# Patient Record
Sex: Female | Born: 1965 | Race: Asian | Hispanic: No | Marital: Married | Smoking: Never smoker
Health system: Southern US, Community
[De-identification: ages and names within clinical notes are randomized; demographics above are authoritative.]

## PROBLEM LIST (undated history)

## (undated) DIAGNOSIS — E0500 Thyrotoxicosis with diffuse goiter without thyrotoxic crisis or storm: Secondary | ICD-10-CM

## (undated) DIAGNOSIS — R51 Headache: Secondary | ICD-10-CM

## (undated) DIAGNOSIS — I471 Supraventricular tachycardia, unspecified: Secondary | ICD-10-CM

## (undated) DIAGNOSIS — E079 Disorder of thyroid, unspecified: Secondary | ICD-10-CM

## (undated) DIAGNOSIS — E785 Hyperlipidemia, unspecified: Secondary | ICD-10-CM

## (undated) DIAGNOSIS — R519 Headache, unspecified: Secondary | ICD-10-CM

## (undated) DIAGNOSIS — R112 Nausea with vomiting, unspecified: Secondary | ICD-10-CM

## (undated) DIAGNOSIS — T4145XA Adverse effect of unspecified anesthetic, initial encounter: Secondary | ICD-10-CM

## (undated) DIAGNOSIS — T8859XA Other complications of anesthesia, initial encounter: Secondary | ICD-10-CM

## (undated) DIAGNOSIS — I499 Cardiac arrhythmia, unspecified: Secondary | ICD-10-CM

## (undated) DIAGNOSIS — Z9889 Other specified postprocedural states: Secondary | ICD-10-CM

## (undated) DIAGNOSIS — K219 Gastro-esophageal reflux disease without esophagitis: Secondary | ICD-10-CM

## (undated) HISTORY — PX: THYROIDECTOMY: SHX17

## (undated) HISTORY — DX: Headache, unspecified: R51.9

## (undated) HISTORY — DX: Thyrotoxicosis with diffuse goiter without thyrotoxic crisis or storm: E05.00

## (undated) HISTORY — DX: Supraventricular tachycardia: I47.1

## (undated) HISTORY — PX: ABDOMINAL HYSTERECTOMY: SHX81

## (undated) HISTORY — PX: BACK SURGERY: SHX140

## (undated) HISTORY — DX: Supraventricular tachycardia, unspecified: I47.10

## (undated) HISTORY — PX: TUBAL LIGATION: SHX77

## (undated) HISTORY — PX: LASIK: SHX215

## (undated) HISTORY — DX: Headache: R51

## (undated) HISTORY — DX: Hyperlipidemia, unspecified: E78.5

---

## 2009-03-12 ENCOUNTER — Inpatient Hospital Stay (HOSPITAL_COMMUNITY): Admission: RE | Admit: 2009-03-12 | Discharge: 2009-03-15 | Payer: Self-pay | Admitting: Neurosurgery

## 2011-01-09 LAB — CBC
Hemoglobin: 14.3 g/dL (ref 12.0–15.0)
MCHC: 34.6 g/dL (ref 30.0–36.0)
MCV: 85 fL (ref 78.0–100.0)
RBC: 4.85 MIL/uL (ref 3.87–5.11)
RDW: 12.4 % (ref 11.5–15.5)

## 2011-01-09 LAB — TYPE AND SCREEN: Antibody Screen: NEGATIVE

## 2011-02-14 NOTE — Op Note (Signed)
[name], MODE                 ACCOUNT NO.:  [id number]   MEDICAL RECORD NO.:  [id number]          PATIENT TYPE:  INP   LOCATION:  2899                         FACILITY:  [facility]   PHYSICIAN:  Danae Orleans. Venetia Maxon, M.D.  DATE OF BIRTH:  1966/04/16   DATE OF PROCEDURE:  03/12/2009  DATE OF DISCHARGE:                               OPERATIVE REPORT   PREOPERATIVE DIAGNOSIS:  Recurrent herniated lumbar disk L5-S1 with  degenerative disk disease, spondylosis, and radiculopathy.   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   PROCEDURES:  1. Redo laminectomy L5-S1  with diskectomy.  2. Posterior lumbar interbody fusion with PEEK interbody cages,      morcellized bone autograft BMP and EquivaBone.  3. Pedicle screw fixation nonsegmental L5 through S1.  4. Posterolateral arthrodesis L5 through S1 levels.   SURGEON:  Danae Orleans. Venetia Maxon, MD   ASSISTANT:  Coletta Memos, MD   ANESTHESIA:  General endotracheal anesthesia.   ESTIMATED BLOOD LOSS:  200 mL.   COMPLICATIONS:  None.   DISPOSITION:  To recovery.   INDICATIONS:  Melissa Levy is a 45 year old woman who previously had a  right L5-S1 diskectomy done at [facility].  She has developed severe  intractable back pain and had recurrent right leg pain to the point  where she is not able to stand or bear weight on her right leg.  An MRI  was obtained which was suggestive of the thickened S1 nerve root on the  right and I suspected that she had a recurrent disk herniation with  compression of the right S1 nerve root.  She also has complete collapse  of the interspace at L5-S1 with modic changes in the endplates.  So, it  was elected to take her to surgery for redo diskectomy and fusion at  this level.   PROCEDURE:  Ms. Artis Flock was brought to the operating room.  Following the  satisfactory and uncomplicated induction of general endotracheal  anesthesia and placement  of intravenous lines and Foley catheter, the  patient was placed in a prone position on the [place] table.  Soft  tissue and bony prominences were padded appropriately.  Low back was  prepped and draped in usual sterile fashion.  Previous incision was  infiltrated with local lidocaine, reopened, extended slightly cephalad  and caudad and carried through adipose tissue.  The lumbodorsal fascia  was incised bilaterally.  Subperiosteal dissection was performed  exposing the L5-S1 interspace, the L5 transverse processes and sacral  ala bilaterally.  Intraoperative x-ray confirmed correct orientation.  A  previous scar tissue was removed on the right-sided for right sided  laminotomy and laminotomy and the facetectomy was performed.  The S1  nerve root was carefully dissected free of investing scar tissue on  using loupe magnification while mobilizing the nerve root and several  large fragments of herniated disk material were removed, which were  directly compressing the S1 nerve root and this relieved the nerve root  significantly and the nerve root no longer appeared to be significantly  deflected or  thickened or tethered.  The interspace was incised and the  spondylitic disk herniation was removed.  Attention was then turned to  the left side where decompression was performed with laminectomy and  facetectomy and a thorough diskectomy.  A #8 interspace distractor was  placed which opened the interspace and a more thorough diskectomy was  then performed on the right side, the interspace has been collapsed at  this level.  After thorough diskectomy and preparation of the endplates,  it was elected to use an 8-mm PEEK interbody cage.  This was packed with  extra small BMP and EquivaBone.  Additional BMP and EquivaBone were  placed within the interspace.  The implant was tamped into position and  countersunk appropriately.  Attention was then turned to the left side  where similarly sized  implant was positioned also with BMP and  EquivaBone.  Additional autograft was placed overlying the cages on  either side tamped into position.  The intraoperative x-ray was  obtained, which showed well-positioned interbody cages, restoration of  disk space height.  Pedicle screw fixation was then placed after  decorticating posterolateral region from L5 through S1.  A 6.5 x 40 mm  sacral screws were placed and 5.5 x 45-mm screws were placed at L5, all  screws had excellent purchase.  Their positioning was confirmed on AP  and lateral fluoroscopy.  No evidence any cutouts.  The 35-mm preloaded  rods were affixed to the screw heads and locked down in situ.  Posterolateral region between L5 and S1 was then packed with remaining  EquivaBone and autograft.  Nerve roots were inspected and felt to be  well decompressed.  Prior to placing final bone graft, the wound was  irrigated.  The self-retaining retractor was removed.  The lumbodorsal  fascia was closed with Vicryl sutures.  Subcutaneous tissues were  approximated with 2-0 Vicryl interrupted inverted sutures and skin edges  were approximated with interrupted 2-0 Vicryl subcu stitch.  The wound  was dressed with Benzoin, Steri-Strips, Telfa gauze, and tape.  The  patient was extubated in the operating room, taken to the recovery room  in stable and satisfactory condition having tolerated the operation  well.  Counts were correct at the end of the case.      Danae Orleans. Venetia Maxon, M.D.  Electronically Signed     JDS/MEDQ  D:  03/12/2009  T:  03/13/2009  Job:  [id number]

## 2011-02-17 NOTE — Discharge Summary (Signed)
Melissa Levy, Melissa Levy                 ACCOUNT NO.:  [id number]   MEDICAL RECORD NO.:  [id number]          PATIENT TYPE:  INP   LOCATION:  3021                         FACILITY:  [facility]   PHYSICIAN:  Danae Orleans. Venetia Maxon, M.D.  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   FINAL DIAGNOSES:  1. 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  2. Lumbar disk degeneration.  3. 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  4. Constipation.  5. Depressive disorder, NEC.   HISTORY OF PRESENT ILLNESS AND HOSPITAL COURSE:  Melissa Levy is a 45-  year-old woman who has previously undergone lumbar diskectomy at L5-S1  at [facility], she developed in 2005.  She has had progressive disk space  collapse and has recurrent disk herniation with significant back greater  than lower extremity pain.  It was elected to take her to surgery for  redo diskectomy, decompression, and fusion at the L5-S1 level.  This was  done on same day admission basis.  She had uncomplicated surgical  procedure.  Postoperatively, she was gradually mobilized.  Back brace  was doing well.  Discharged home on 14th.  Given instructions to follow  up in the office for postoperative visit.  She was discharged home with  some Percocet for pain.      Danae Orleans. Venetia Maxon, M.D.  Electronically Signed     JDS/MEDQ  D:  04/23/2009  T:  04/24/2009  Job:  [id number]

## 2013-05-16 ENCOUNTER — Other Ambulatory Visit: Payer: Self-pay

## 2013-05-16 ENCOUNTER — Other Ambulatory Visit: Payer: Self-pay | Admitting: Physician Assistant

## 2013-05-16 DIAGNOSIS — Z1231 Encounter for screening mammogram for malignant neoplasm of breast: Secondary | ICD-10-CM

## 2013-06-23 ENCOUNTER — Ambulatory Visit
Admission: RE | Admit: 2013-06-23 | Discharge: 2013-06-23 | Disposition: A | Discharge status: Home / Self Care | Payer: BC Managed Care – PPO | Source: Ambulatory Visit

## 2013-06-23 DIAGNOSIS — Z1231 Encounter for screening mammogram for malignant neoplasm of breast: Secondary | ICD-10-CM

## 2014-04-18 ENCOUNTER — Encounter (HOSPITAL_COMMUNITY): Payer: Self-pay | Admitting: Emergency Medicine

## 2014-04-18 ENCOUNTER — Emergency Department (HOSPITAL_COMMUNITY)
Admission: EM | Admit: 2014-04-18 | Discharge: 2014-04-18 | Disposition: A | Discharge status: Home / Self Care | Payer: BC Managed Care – PPO | Attending: Emergency Medicine | Admitting: Emergency Medicine

## 2014-04-18 DIAGNOSIS — R110 Nausea: Secondary | ICD-10-CM | POA: Insufficient documentation

## 2014-04-18 DIAGNOSIS — R0989 Other specified symptoms and signs involving the circulatory and respiratory systems: Secondary | ICD-10-CM | POA: Insufficient documentation

## 2014-04-18 DIAGNOSIS — R61 Generalized hyperhidrosis: Secondary | ICD-10-CM | POA: Insufficient documentation

## 2014-04-18 DIAGNOSIS — R0609 Other forms of dyspnea: Secondary | ICD-10-CM | POA: Insufficient documentation

## 2014-04-18 DIAGNOSIS — I471 Supraventricular tachycardia, unspecified: Secondary | ICD-10-CM | POA: Insufficient documentation

## 2014-04-18 DIAGNOSIS — Z8639 Personal history of other endocrine, nutritional and metabolic disease: Secondary | ICD-10-CM | POA: Insufficient documentation

## 2014-04-18 DIAGNOSIS — Z862 Personal history of diseases of the blood and blood-forming organs and certain disorders involving the immune mechanism: Secondary | ICD-10-CM | POA: Insufficient documentation

## 2014-04-18 HISTORY — DX: Disorder of thyroid, unspecified: E07.9

## 2014-04-18 LAB — CBC WITH DIFFERENTIAL/PLATELET
BASOS ABS: 0.1 10*3/uL (ref 0.0–0.1)
BASOS PCT: 0 % (ref 0–1)
Eosinophils Absolute: 0.1 10*3/uL (ref 0.0–0.7)
Eosinophils Relative: 1 % (ref 0–5)
HCT: 38.1 % (ref 36.0–46.0)
Hemoglobin: 13.6 g/dL (ref 12.0–15.0)
Lymphocytes Relative: 21 % (ref 12–46)
Lymphs Abs: 3.2 10*3/uL (ref 0.7–4.0)
MCH: 30.4 pg (ref 26.0–34.0)
MCHC: 35.7 g/dL (ref 30.0–36.0)
MCV: 85 fL (ref 78.0–100.0)
Monocytes Absolute: 0.9 10*3/uL (ref 0.1–1.0)
Monocytes Relative: 6 % (ref 3–12)
NEUTROS ABS: 11.2 10*3/uL — AB (ref 1.7–7.7)
NEUTROS PCT: 72 % (ref 43–77)
PLATELETS: 321 10*3/uL (ref 150–400)
RBC: 4.48 MIL/uL (ref 3.87–5.11)
RDW: 12.4 % (ref 11.5–15.5)
WBC: 15.4 10*3/uL — ABNORMAL HIGH (ref 4.0–10.5)

## 2014-04-18 LAB — BASIC METABOLIC PANEL
ANION GAP: 14 (ref 5–15)
BUN: 13 mg/dL (ref 6–23)
CHLORIDE: 102 meq/L (ref 96–112)
CO2: 22 mEq/L (ref 19–32)
Calcium: 9.7 mg/dL (ref 8.4–10.5)
Creatinine, Ser: 0.8 mg/dL (ref 0.50–1.10)
GFR calc non Af Amer: 87 mL/min — ABNORMAL LOW (ref >90)
Glucose, Bld: 175 mg/dL — ABNORMAL HIGH (ref 70–99)
POTASSIUM: 4.2 meq/L (ref 3.7–5.3)
Sodium: 138 mEq/L (ref 137–147)

## 2014-04-18 LAB — I-STAT CG4 LACTIC ACID, ED
LACTIC ACID, VENOUS: 1.83 mmol/L (ref 0.5–2.2)
Lactic Acid, Venous: 2.94 mmol/L — ABNORMAL HIGH (ref 0.5–2.2)

## 2014-04-18 MED ORDER — SODIUM CHLORIDE 0.9 % IV SOLN
1000.0000 mL | Freq: Once | INTRAVENOUS | Status: AC
Start: 2014-04-18 — End: 2014-04-18
  Administered 2014-04-18: 1000 mL via INTRAVENOUS

## 2014-04-18 MED ORDER — ONDANSETRON HCL 4 MG/2ML IJ SOLN
INTRAMUSCULAR | Status: AC
  Filled 2014-04-18: qty 2

## 2014-04-18 MED ORDER — ONDANSETRON HCL 4 MG/2ML IJ SOLN
4.0000 mg | Freq: Once | INTRAMUSCULAR | Status: DC
Start: 2014-04-18 — End: 2014-04-18

## 2014-04-18 MED ORDER — SODIUM CHLORIDE 0.9 % IV SOLN
1000.0000 mL | INTRAVENOUS | Status: DC
  Administered 2014-04-18: 1000 mL via INTRAVENOUS

## 2014-04-18 MED ORDER — ADENOSINE 6 MG/2ML IV SOLN
INTRAVENOUS | Status: AC
  Filled 2014-04-18: qty 2

## 2014-04-18 MED ORDER — ADENOSINE 6 MG/2ML IV SOLN
INTRAVENOUS | Status: AC
  Administered 2014-04-18: 6 mg
  Filled 2014-04-18: qty 2

## 2014-04-18 MED ORDER — ONDANSETRON HCL 4 MG/2ML IJ SOLN
4.0000 mg | Freq: Once | INTRAMUSCULAR | Status: AC
  Administered 2014-04-18: 4 mg via INTRAVENOUS

## 2014-04-18 NOTE — Discharge Instructions (Signed)
Supraventricular Tachycardia °Supraventricular tachycardia (SVT) is an abnormal heart rhythm (arrhythmia) that causes the heart to beat very fast (tachycardia). This kind of fast heartbeat originates in the upper chambers of the heart (atria). SVT can cause the heart to beat greater than 100 beats per minute. SVT can have a rapid burst of heartbeats. This can start and stop suddenly without warning and is called nonsustained. SVT can also be sustained, in which the heart beats at a continuous fast rate.  °CAUSES  °There can be different causes of SVT. Some of these include: °· Heart valve problems such as mitral valve prolapse. °· An enlarged heart (hypertrophic cardiomyopathy). °· Congenital heart problems. °· Heart inflammation (pericarditis). °· Hyperthyroidism. °· Low potassium or magnesium levels. °· Caffeine. °· Drug use such as cocaine, methamphetamines, or stimulants. °· Some over-the-counter medicines such as: °¨ Decongestants. °¨ Diet medicines. °¨ Herbal medicines. °SYMPTOMS  °Symptoms of SVT can vary. Symptoms depend on whether the SVT is sustained or nonsustained. You may experience: °· No symptoms (asymptomatic). °· An awareness of your heart beating rapidly (palpitations). °· Shortness of breath. °· Chest pain or pressure. °If your blood pressure drops because of the SVT, you may experience: °· Fainting or near fainting. °· Weakness. °· Dizziness. °DIAGNOSIS  °Different tests can be performed to diagnose SVT, such as: °· An electrocardiogram (EKG). This is a painless test that records the electrical activity of your heart. °· Holter monitor. This is a 24 hour recording of your heart rhythm. You will be given a diary. Write down all symptoms that you have and what you were doing at the time you experienced symptoms. °· Arrhythmia monitor. This is a small device that your wear for several weeks. It records the heart rhythm when you have symptoms. °· Echocardiogram. This is an imaging test to help detect  abnormal heart structure such as congenital abnormalities, heart valve problems, or heart enlargement. °· Stress test. This test can help determine if the SVT is related to exercise. °· Electrophysiology study (EPS). This is a procedure that evaluates your heart's electrical system and can help your caregiver find the cause of your SVT. °TREATMENT  °Treatment of SVT depends on the symptoms, how often it recurs, and whether there are any underlying heart problems.  °· If symptoms are rare and no other cardiac disease is present, no treatment may be needed. °· Blood work may be done to check potassium, magnesium, and thyroid hormone levels to see if they are abnormal. If these levels are abnormal, treatment to correct the problems will occur. °Medicines °Your caregiver may use oral medicines to treat SVT. These medicines are given for long-term control of SVT. Medicines may be used alone or in combination with other treatments. These medicines work to slow nerve impulses in the heart muscle. These medicines can also be used to treat high blood pressure. Some of these medicines may include: °· Calcium channel blockers. °· Beta blockers. °· Digoxin. °Nonsurgical procedures °Nonsurgical techniques may be used if oral medicines do not work. Some examples include: °· Cardioversion. This technique uses either drugs or an electrical shock to restore a normal heart rhythm. °¨ Cardioversion drugs may be given through an intravenous (IV) line to help "reset" the heart rhythm. °¨ In electrical cardioversion, the caregiver shocks your heart to stop its beat for a split second. This helps to reset the heart to a normal rhythm. °· Ablation. This procedure is done under mild sedation. High frequency radio wave energy is used to   destroy the area of heart tissue responsible for the SVT. °HOME CARE INSTRUCTIONS  °· Do not smoke. °· Only take medicines prescribed by your caregiver. Check with your caregiver before using over-the-counter  medicines. °· Check with your caregiver about how much alcohol and caffeine (coffee, tea, colas, or chocolate) you may have. °· It is very important to keep all follow-up referrals and appointments in order to properly manage this problem. °SEEK IMMEDIATE MEDICAL CARE IF: °· You have dizziness. °· You faint or nearly faint. °· You have shortness of breath. °· You have chest pain or pressure. °· You have sudden nausea or vomiting. °· You have profuse sweating. °· You are concerned about how long your symptoms last. °· You are concerned about the frequency of your SVT episodes. °If you have the above symptoms, call your local emergency services (911 in [place] immediately. Do not drive yourself to the hospital. °MAKE SURE YOU:  °· Understand these instructions. °· Will watch your condition. °· Will get help right away if you are not doing well or get worse. °Document Released: 09/18/2005 Document Revised: 12/11/2011 Document Reviewed: 12/31/2008 °ExitCare® Patient Information ©2015 [employer]. This information is not intended to replace advice given to you by your health care provider. Make sure you discuss any questions you have with your health care provider. ° °

## 2014-04-18 NOTE — ED Notes (Signed)
Patient states she has a history of a-fib and around 2300, she was awakened by palpitations, shortness of breath, nausea and sweating.

## 2014-04-18 NOTE — ED Provider Notes (Signed)
CSN: [id number]     Arrival date & time 04/18/14  0157 History   First MD Initiated Contact with Patient 04/18/14 0210     Chief Complaint  Patient presents with  . Chest Pain     (Consider location/radiation/quality/duration/timing/severity/associated sxs/prior Treatment) Patient is a 48 y.o. female presenting with chest pain. The history is provided by the patient.  Chest Pain She was awakened at about 11 PM with a sense of her heart racing. This is associated with dyspnea and tightness in her chest and nausea and diaphoresis. She has had similar episodes in the past from atrial fibrillation. She tried a variety of vagal maneuvers to try to reduce her heart rate but these were unsuccessful. She states that she had not felt well all day. She states her last episode was about 3 years ago. She had had some alcohol earlier in the day and normally she does not drink alcohol.  Past Medical History  Diagnosis Date  . Atrial fibrillation   . Thyroid disease    History reviewed. No pertinent past surgical history. No family history on file. History  Substance Use Topics  . Smoking status: Never Smoker   . Smokeless tobacco: Not on file  . Alcohol Use: Yes   OB History   Grav Para Term Preterm Abortions TAB SAB Ect Mult Living                 Review of Systems  Cardiovascular: Positive for chest pain.  All other systems reviewed and are negative.     Allergies  Dilaudid  Home Medications   Prior to Admission medications   Not on File   BP 75/51  Pulse 216  Temp(Src) 97.8 F (36.6 C) (Oral)  Resp 24  Ht 5\' 9"  (1.753 m)  Wt 230 lb (104.327 kg)  BMI 33.95 kg/m2  SpO2 100% Physical Exam  Nursing note and vitals reviewed.  48 year old female, resting comfortably and in no acute distress. Vital signs are significant for hypertension with blood pressure 75/51, and tachycardia with heart rate of 216, and tachypnea with respiratory rate of 24. Oxygen saturation is 100%,  which is normal. Head is normocephalic and atraumatic. PERRLA, EOMI. Oropharynx is clear. Neck is nontender and supple without adenopathy or JVD. Back is nontender and there is no CVA tenderness. Lungs are clear without rales, wheezes, or rhonchi. Chest is nontender. Heart is tachycardic without murmur. Abdomen is soft, flat, nontender without masses or hepatosplenomegaly and peristalsis is normoactive. Extremities have no cyanosis or edema, full range of motion is present. Skin is warm and mildly diaphoretic without rash. Neurologic: Mental status is normal, cranial nerves are intact, there are no motor or sensory deficits.  ED Course  Procedures (including critical care time) Labs Review Results for orders placed during the hospital encounter of 04/18/14  CBC WITH DIFFERENTIAL      Result Value Ref Range   WBC 15.4 (*) 4.0 - 10.5 K/uL   RBC 4.48  3.87 - 5.11 MIL/uL   Hemoglobin 13.6  12.0 - 15.0 g/dL   HCT 65.738.1  84.636.0 - 96.246.0 %   MCV 85.0  78.0 - 100.0 fL   MCH 30.4  26.0 - 34.0 pg   MCHC 35.7  30.0 - 36.0 g/dL   RDW 95.212.4  84.111.5 - 32.415.5 %   Platelets 321  150 - 400 K/uL   Neutrophils Relative % 72  43 - 77 %   Neutro Abs 11.2 (*) 1.7 - 7.7  K/uL   Lymphocytes Relative 21  12 - 46 %   Lymphs Abs 3.2  0.7 - 4.0 K/uL   Monocytes Relative 6  3 - 12 %   Monocytes Absolute 0.9  0.1 - 1.0 K/uL   Eosinophils Relative 1  0 - 5 %   Eosinophils Absolute 0.1  0.0 - 0.7 K/uL   Basophils Relative 0  0 - 1 %   Basophils Absolute 0.1  0.0 - 0.1 K/uL  BASIC METABOLIC PANEL      Result Value Ref Range   Sodium 138  137 - 147 mEq/L   Potassium 4.2  3.7 - 5.3 mEq/L   Chloride 102  96 - 112 mEq/L   CO2 22  19 - 32 mEq/L   Glucose, Bld 175 (*) 70 - 99 mg/dL   BUN 13  6 - 23 mg/dL   Creatinine, Ser 4.09  0.50 - 1.10 mg/dL   Calcium 9.7  8.4 - 81.1 mg/dL   GFR calc non Af Amer 87 (*) >90 mL/min   GFR calc Af Amer >90  >90 mL/min   Anion gap 14  5 - 15  I-STAT CG4 LACTIC ACID, ED      Result  Value Ref Range   Lactic Acid, Venous 2.94 (*) 0.5 - 2.2 mmol/L  I-STAT CG4 LACTIC ACID, ED      Result Value Ref Range   Lactic Acid, Venous 1.83  0.5 - 2.2 mmol/L   Procedure: Chemical cardioversion Indication: Supraventricular tachycardia Informed consent not obtained because of the emergent nature of procedure Prior to procedure, the patient was identified by 2 identifiers-verbally and with name band. A preprocedure timeout was held. Description: The patient was given 6 mg of adenosine intravenously with successful conversion to sinus rhythm. Patient tolerated procedure well without complications.  Imaging Review No results found.   EKG Interpretation   Date/Time:  Saturday April 18 2014 02:04:38 EDT Ventricular Rate:  206 PR Interval:    QRS Duration: 71 QT Interval:  267 QTC Calculation: 494 R Axis:   80 Text Interpretation:  Supraventricular tachycardia ST depression, probably  rate related No old tracing to compare Confirmed by [facility]  MD, Dechelle Attaway  ([id number]) on 04/18/2014 2:20:57 AM       EKG Interpretation   Date/Time:  Saturday April 18 2014 02:15:41 EDT Ventricular Rate:  101 PR Interval:  150 QRS Duration: 86 QT Interval:  348 QTC Calculation: 451 R Axis:   77 Text Interpretation:  Sinus tachycardia Otherwise within normal limits  When compared with ECG of 04/18/2014, Sinus tachycardia has replaced  Supraventricular tachycardia ST depression has resolved Confirmed by [facility]   MD, Luwanda Starr ([id number]) on 04/18/2014 2:23:43 AM       MDM   Final diagnoses:  Paroxysmal supraventricular tachycardia    Paroxysmal supraventricular tachycardia. She's given an dose of adenosine with successful conversion. Following this, chest tightness and dyspnea have improved. Because of hypotension, lactic acid level will be checked and she will be given IV hydration.  Initial lactic acid levels come back slightly elevated but has normalized with IV hydration and normalization of heart  rhythm. She has been observed in the ED with ongoing borderline hypertension but blood pressure is now over 100. She has had no further rhythm disturbance and is felt to be stable to go home.  Dione Booze, MD 04/18/14 [phone]

## 2014-04-22 ENCOUNTER — Encounter: Payer: Self-pay | Admitting: Cardiology

## 2014-04-22 ENCOUNTER — Ambulatory Visit (INDEPENDENT_AMBULATORY_CARE_PROVIDER_SITE_OTHER): Payer: BC Managed Care – PPO | Admitting: Cardiology

## 2014-04-22 VITALS — BP 120/90 | HR 90 | Ht 69.0 in | Wt 226.0 lb

## 2014-04-22 DIAGNOSIS — R7309 Other abnormal glucose: Secondary | ICD-10-CM

## 2014-04-22 DIAGNOSIS — I471 Supraventricular tachycardia: Secondary | ICD-10-CM

## 2014-04-22 DIAGNOSIS — R079 Chest pain, unspecified: Secondary | ICD-10-CM

## 2014-04-22 DIAGNOSIS — D72829 Elevated white blood cell count, unspecified: Secondary | ICD-10-CM | POA: Insufficient documentation

## 2014-04-22 DIAGNOSIS — I498 Other specified cardiac arrhythmias: Secondary | ICD-10-CM

## 2014-04-22 MED ORDER — METOPROLOL SUCCINATE ER 25 MG PO TB24: 25.0000 mg | ORAL_TABLET | Freq: Every day | ORAL | Status: DC

## 2014-04-22 NOTE — Addendum Note (Signed)
Addended by: Freddi StarrMATHIS, Merikay Lesniewski W on: 04/22/2014 05:22 PM   Modules accepted: Orders

## 2014-04-22 NOTE — Assessment & Plan Note (Signed)
Patient has documented supraventricular tachycardia.She has had spells intermittently since her 30s. She was extremely symptomatic with her recent episode that required adenosine. 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 Add Toprol 25 mg daily. Schedule echocardiogram to assess LV function. I will arrange electrophysiology evaluation for ablation.

## 2014-04-22 NOTE — Progress Notes (Signed)
HPI: 12 show female for evaluation of SVT. Recently in the emergency room for palpitations and found to be in SVT. Given adenosine and converted to sinus rhythm. Laboratories showed a white blood cell count of 15.4 and a glucose of 175. Patient has had occasional bursts of palpitations since her 30s. These were typically short lived and can be relieved with Valsalva or belching. On July 17 she was awakened from sleep with palpitations described as her heart racing. She then developed chest tightness, dyspnea and diaphoresis. There was mild dizziness. Her symptoms persisted and she went to the emergency room as described above. Since that time she has continued to have chest tightness continuously. There is increased with exertion and also deep inspiration. She has felt lightheaded as well. No further palpitations. She typically does not have dyspnea on exertion, orthopnea, PND, pedal edema, exertional chest pain or syncope. She has not traveled recently and no recent leg injury.  Current Outpatient Prescriptions  Medication Sig Dispense Refill  . ALPRAZolam (XANAX) 0.25 MG tablet Take 0.25 mg by mouth as needed.       . Black Cohosh 540 MG CAPS Take 540 mg by mouth daily.      . Cranberry 500 MG CAPS Take by mouth daily.      Marland Kitchen ibuprofen (ADVIL,MOTRIN) 200 MG tablet Take 200 mg by mouth as needed.      . magnesium oxide (MAG-OX) 400 MG tablet Take 400 mg by mouth daily.      . Multiple Vitamin (MULTIVITAMIN WITH MINERALS) TABS tablet Take 2 tablets by mouth daily.      . Omega 3 1000 MG CAPS Take by mouth daily.      Marland Kitchen POTASSIUM CITRATE PO Take 99 mcg by mouth daily.      . ranitidine (ZANTAC) 150 MG tablet Take 150 mg by mouth daily.      . Red Yeast Rice 600 MG TABS Take 1,200 mg by mouth daily.      Marland Kitchen SYNTHROID 125 MCG tablet Take 125 mcg by mouth daily before breakfast.       . venlafaxine XR (EFFEXOR-XR) 37.5 MG 24 hr capsule Take 37.5 mg by mouth 2 (two) times daily.       . vitamin  B-12 (CYANOCOBALAMIN) 500 MCG tablet Take 500 mcg by mouth daily.      . vitamin C (ASCORBIC ACID) 500 MG tablet Take 500 mg by mouth daily.       No current facility-administered medications for this visit.    Allergies  Allergen Reactions  . Dilaudid [Hydromorphone Hcl]     Past Medical History  Diagnosis Date  . Thyroid disease     [place]; now hypothyroid  . Hyperlipidemia   . SVT (supraventricular tachycardia)     Past Surgical History  Procedure Laterality Date  . Back surgery    . Abdominal hysterectomy    . Thyroidectomy      History   Social History  . Marital Status: Married    Spouse Name: N/A    Number of Children: 4  . Years of Education: N/A   Occupational History  .     Social History Main Topics  . Smoking status: Never Smoker   . Smokeless tobacco: Not on file  . Alcohol Use: Yes     Comment: Occasional  . Drug Use: No  . Sexual Activity: Not on file   Other Topics Concern  . Not on file   Social History  Narrative  . No narrative on file    Family History  Problem Relation Age of Onset  . Heart disease Father     Atrial fibrillation; ablation    ROS: Some dizziness but no fevers or chills, productive cough, hemoptysis, dysphasia, odynophagia, melena, hematochezia, dysuria, hematuria, rash, seizure activity, orthopnea, PND, pedal edema, claudication. Remaining systems are negative.  Physical Exam:   Blood pressure 120/90, pulse 90, height 5\' 9"  (1.753 m), weight 226 lb (102.513 kg).  General:  Well developed/well nourished in NAD Skin warm/dry Patient not depressed No peripheral clubbing Back-normal HEENT-normal/normal eyelids Neck supple/normal carotid upstroke bilaterally; no bruits; no JVD; no thyromegaly chest - CTA/ normal expansion CV - RRR/normal S1 and S2; no rubs or gallops;  PMI nondisplaced, Soft 1/6 systolic ejection murmur. Abdomen -NT/ND, no HSM, no mass, + bowel sounds, no bruit 2+ femoral pulses, no  bruits Ext-no edema, chords, 2+ DP Neuro-grossly nonfocal  ECG 04/18/2014-supraventricular tachycardia, rate 206, mild diffuse ST depression. Followup electrocardiogram showed sinus rhythm with no ST changes. Electrocardiogram today shows sinus rhythm with no ST changes.

## 2014-04-22 NOTE — Assessment & Plan Note (Signed)
Patient has had persistent chest ache/tightness since her bout of SVT. There is some increased with inspiration. I will arrange a d-dimer to screen for pulmonary embolus. Check chest x-ray. There is increased with ambulation. Arrange exercise treadmill for risk stratification.

## 2014-04-22 NOTE — Patient Instructions (Signed)
Your physician recommends that you schedule a follow-up appointment in: 4-6 WEEKS IN [place]  START METOPROLOL SUCC ER 25 MG ONCE DAILY  Your physician has requested that you have an echocardiogram. Echocardiography is a painless test that uses sound waves to create images of your heart. It provides your doctor with information about the size and shape of your heart and how well your heart's chambers and valves are working. This procedure takes approximately one hour. There are no restrictions for this procedure.   REFERRAL TO DR Lewayne BuntingGREGG TAYLOR TO DISCUSS SVT ABLATION  Your physician recommends that you return for lab work TOMORROW  Your physician has requested that you have an exercise tolerance test. For further information please visit [email or web address]. Please also follow instruction sheet, as given.

## 2014-04-22 NOTE — Assessment & Plan Note (Signed)
Patient's glucose was elevated at 175 during a recent ER evaluation. Check hemoglobin A1c and if elevated followup with her primary care as she may be diabetic.

## 2014-04-22 NOTE — Assessment & Plan Note (Signed)
Repeat CBC and differential white blood cell count remains elevated followup primary care.

## 2014-04-23 ENCOUNTER — Other Ambulatory Visit: Payer: Self-pay | Admitting: *Deleted

## 2014-04-23 ENCOUNTER — Ambulatory Visit
Admission: RE | Admit: 2014-04-23 | Discharge: 2014-04-23 | Disposition: A | Discharge status: Home / Self Care | Payer: BC Managed Care – PPO | Source: Ambulatory Visit | Attending: Cardiology | Admitting: Cardiology

## 2014-04-23 ENCOUNTER — Ambulatory Visit (INDEPENDENT_AMBULATORY_CARE_PROVIDER_SITE_OTHER): Payer: BC Managed Care – PPO | Admitting: Internal Medicine

## 2014-04-23 ENCOUNTER — Encounter: Payer: Self-pay | Admitting: Internal Medicine

## 2014-04-23 ENCOUNTER — Ambulatory Visit (HOSPITAL_COMMUNITY)
Admission: RE | Admit: 2014-04-23 | Discharge: 2014-04-23 | Disposition: A | Discharge status: Home / Self Care | Payer: BC Managed Care – PPO | Source: Ambulatory Visit | Attending: Cardiology | Admitting: Cardiology

## 2014-04-23 VITALS — BP 126/84 | HR 74 | Ht 69.0 in | Wt 225.8 lb

## 2014-04-23 DIAGNOSIS — I498 Other specified cardiac arrhythmias: Secondary | ICD-10-CM | POA: Insufficient documentation

## 2014-04-23 DIAGNOSIS — R079 Chest pain, unspecified: Secondary | ICD-10-CM

## 2014-04-23 DIAGNOSIS — I519 Heart disease, unspecified: Secondary | ICD-10-CM

## 2014-04-23 DIAGNOSIS — I471 Supraventricular tachycardia: Secondary | ICD-10-CM

## 2014-04-23 LAB — CBC WITH DIFFERENTIAL/PLATELET
BASOS ABS: 0 10*3/uL (ref 0.0–0.1)
Basophils Relative: 0 % (ref 0–1)
EOS PCT: 2 % (ref 0–5)
Eosinophils Absolute: 0.1 10*3/uL (ref 0.0–0.7)
HEMATOCRIT: 37.9 % (ref 36.0–46.0)
Hemoglobin: 13.6 g/dL (ref 12.0–15.0)
LYMPHS ABS: 1.9 10*3/uL (ref 0.7–4.0)
LYMPHS PCT: 25 % (ref 12–46)
MCH: 29.8 pg (ref 26.0–34.0)
MCHC: 35.9 g/dL (ref 30.0–36.0)
MCV: 82.9 fL (ref 78.0–100.0)
MONO ABS: 0.5 10*3/uL (ref 0.1–1.0)
MONOS PCT: 7 % (ref 3–12)
NEUTROS ABS: 4.9 10*3/uL (ref 1.7–7.7)
Neutrophils Relative %: 66 % (ref 43–77)
Platelets: 324 10*3/uL (ref 150–400)
RBC: 4.57 MIL/uL (ref 3.87–5.11)
RDW: 12.9 % (ref 11.5–15.5)
WBC: 7.4 10*3/uL (ref 4.0–10.5)

## 2014-04-23 LAB — BASIC METABOLIC PANEL
BUN: 15 mg/dL (ref 6–23)
CO2: 25 meq/L (ref 19–32)
CREATININE: 0.63 mg/dL (ref 0.50–1.10)
Calcium: 8.8 mg/dL (ref 8.4–10.5)
Chloride: 102 mEq/L (ref 96–112)
Glucose, Bld: 98 mg/dL (ref 70–99)
POTASSIUM: 4.5 meq/L (ref 3.5–5.3)
Sodium: 135 mEq/L (ref 135–145)

## 2014-04-23 LAB — HEMOGLOBIN A1C
Hgb A1c MFr Bld: 5.7 % — ABNORMAL HIGH (ref <5.7)
MEAN PLASMA GLUCOSE: 117 mg/dL — AB (ref <117)

## 2014-04-23 NOTE — Progress Notes (Signed)
HPI Melissa Levy is referred today by Dr. Jens Somrenshaw for evaluationof SVT. She is a pleasant 48 yo woman with a 15 year history of tachypalpitations who has had documented SVT at 175/min, which persisted such that she presented to the hospital with SVT and underwent infusion of Adenosine. In the interim she was started on metoprolol. She has multiple episodes in the past which can usually be terminated with valsalva maneuvers. Her episodes are associated with chest pressure, nausea, and diaphoresis. Allergies  Allergen Reactions  . Dilaudid [Hydromorphone Hcl]      Current Outpatient Prescriptions  Medication Sig Dispense Refill  . ALPRAZolam (XANAX) 0.25 MG tablet Take 0.25 mg by mouth as needed.       . Black Cohosh 540 MG CAPS Take 540 mg by mouth daily.      . Cranberry 500 MG CAPS Take 1 capsule by mouth daily.       Marland Kitchen. ibuprofen (ADVIL,MOTRIN) 200 MG tablet Take 200 mg by mouth as needed.      . magnesium oxide (MAG-OX) 400 MG tablet Take 400 mg by mouth daily.      . metoprolol succinate (TOPROL XL) 25 MG 24 hr tablet Take 1 tablet (25 mg total) by mouth daily.  30 tablet  12  . Multiple Vitamin (MULTIVITAMIN WITH MINERALS) TABS tablet Take 2 tablets by mouth daily.      . Omega 3 1000 MG CAPS Take 1 capsule by mouth daily.       Marland Kitchen. POTASSIUM CITRATE PO Take 99 mcg by mouth daily.      . ranitidine (ZANTAC) 150 MG tablet Take 150 mg by mouth daily.      . Red Yeast Rice 600 MG TABS Take 1,200 mg by mouth daily.      Marland Kitchen. SYNTHROID 125 MCG tablet Take 125 mcg by mouth daily before breakfast.       . venlafaxine XR (EFFEXOR-XR) 37.5 MG 24 hr capsule Take 37.5 mg by mouth 2 (two) times daily.       . vitamin B-12 (CYANOCOBALAMIN) 500 MCG tablet Take 500 mcg by mouth daily.      . vitamin C (ASCORBIC ACID) 500 MG tablet Take 500 mg by mouth daily.       No current facility-administered medications for this visit.     Past Medical History  Diagnosis Date  . Thyroid disease    Graves; now hypothyroid  . Hyperlipidemia   . SVT (supraventricular tachycardia)     ROS:   All systems reviewed and negative except as noted in the HPI.   Past Surgical History  Procedure Laterality Date  . Back surgery    . Abdominal hysterectomy    . Thyroidectomy       Family History  Problem Relation Age of Onset  . Heart disease Father     Atrial fibrillation; ablation     History   Social History  . Marital Status: Married    Spouse Name: N/A    Number of Children: 4  . Years of Education: N/A   Occupational History  .     Social History Main Topics  . Smoking status: Never Smoker   . Smokeless tobacco: Not on file  . Alcohol Use: Yes     Comment: Occasional  . Drug Use: No  . Sexual Activity: Not on file   Other Topics Concern  . Not on file   Social History Narrative  . No narrative on file  BP 126/84  Pulse 74  Ht 5\' 9"  (1.753 m)  Wt 225 lb 12.8 oz (102.422 kg)  BMI 33.33 kg/m2  Physical Exam:  Well appearing 48 yo woman, NAD HEENT: Unremarkable Neck:  No JVD, no thyromegally Back:  No CVA tenderness Lungs:  Clear with no wheezes HEART:  Regular rate rhythm, no murmurs, no rubs, no clicks Abd:  soft, positive bowel sounds, no organomegally, no rebound, no guarding Ext:  2 plus pulses, no edema, no cyanosis, no clubbing Skin:  No rashes no nodules Neuro:  CN II through XII intact, motor grossly intact  EKG - nsr with no pre-excitation   Assess/Plan:

## 2014-04-23 NOTE — Assessment & Plan Note (Signed)
Her symptoms appear to be worsened. She has just started beta blockers. I have asked her to call us if she is intolerant of her meds or has had recurrent SVT despite medical therapy. If so, catheter ablation would be warranted.

## 2014-04-23 NOTE — Patient Instructions (Signed)
Your physician has recommended that you have an ablation. Catheter ablation is a medical procedure used to treat some cardiac arrhythmias (irregular heartbeats). During catheter ablation, a long, thin, flexible tube is put into a blood vessel in your groin (upper thigh), or neck. This tube is called an ablation catheter. It is then guided to your heart through the blood vessel. Radio frequency waves destroy small areas of heart tissue where abnormal heartbeats may cause an arrhythmia to start. Please see the instruction sheet given to you today.  Dates are 8/10, 8/11, 8/12, 8/18, 8/20, 8/26,8/28

## 2014-04-23 NOTE — Progress Notes (Signed)
2D Echocardiogram Complete.  04/23/2014   Bawi Lakins, RDCS  

## 2014-04-24 ENCOUNTER — Telehealth: Payer: Self-pay | Admitting: Internal Medicine

## 2014-04-24 DIAGNOSIS — I471 Supraventricular tachycardia: Secondary | ICD-10-CM

## 2014-04-24 LAB — D-DIMER, QUANTITATIVE: D-Dimer, Quant: 0.27 ug/mL-FEU (ref 0.00–0.48)

## 2014-04-24 NOTE — Telephone Encounter (Signed)
Ablation 05/12/14  Labs 8/4 at 7:30

## 2014-04-24 NOTE — Telephone Encounter (Signed)
°  Patient is calling in to schedule her ablation. She is requesting 8/11 or 8/12. Please call and advise.

## 2014-04-28 ENCOUNTER — Encounter (HOSPITAL_COMMUNITY): Payer: Self-pay | Admitting: Pharmacy Technician

## 2014-05-01 ENCOUNTER — Telehealth (HOSPITAL_COMMUNITY): Payer: Self-pay

## 2014-05-01 NOTE — Telephone Encounter (Signed)
Encounter complete. 

## 2014-05-04 ENCOUNTER — Encounter: Payer: Self-pay | Admitting: *Deleted

## 2014-05-04 ENCOUNTER — Other Ambulatory Visit: Payer: Self-pay | Admitting: *Deleted

## 2014-05-06 ENCOUNTER — Telehealth: Payer: Self-pay | Admitting: Internal Medicine

## 2014-05-06 ENCOUNTER — Ambulatory Visit (HOSPITAL_COMMUNITY)
Admission: RE | Admit: 2014-05-06 | Discharge: 2014-05-06 | Disposition: A | Discharge status: Home / Self Care | Payer: BC Managed Care – PPO | Source: Ambulatory Visit | Attending: Cardiology | Admitting: Cardiology

## 2014-05-06 ENCOUNTER — Other Ambulatory Visit (INDEPENDENT_AMBULATORY_CARE_PROVIDER_SITE_OTHER): Payer: BC Managed Care – PPO

## 2014-05-06 DIAGNOSIS — I498 Other specified cardiac arrhythmias: Secondary | ICD-10-CM | POA: Insufficient documentation

## 2014-05-06 DIAGNOSIS — R079 Chest pain, unspecified: Secondary | ICD-10-CM | POA: Insufficient documentation

## 2014-05-06 DIAGNOSIS — I471 Supraventricular tachycardia: Secondary | ICD-10-CM

## 2014-05-06 LAB — CBC WITH DIFFERENTIAL/PLATELET
BASOS PCT: 0.5 % (ref 0.0–3.0)
Basophils Absolute: 0 10*3/uL (ref 0.0–0.1)
EOS PCT: 1.4 % (ref 0.0–5.0)
Eosinophils Absolute: 0.1 10*3/uL (ref 0.0–0.7)
HCT: 37.4 % (ref 36.0–46.0)
Hemoglobin: 12.9 g/dL (ref 12.0–15.0)
LYMPHS PCT: 22.3 % (ref 12.0–46.0)
Lymphs Abs: 2.1 10*3/uL (ref 0.7–4.0)
MCHC: 34.5 g/dL (ref 30.0–36.0)
MCV: 86.7 fl (ref 78.0–100.0)
Monocytes Absolute: 0.5 10*3/uL (ref 0.1–1.0)
Monocytes Relative: 5.5 % (ref 3.0–12.0)
NEUTROS PCT: 70.3 % (ref 43.0–77.0)
Neutro Abs: 6.5 10*3/uL (ref 1.4–7.7)
PLATELETS: 287 10*3/uL (ref 150.0–400.0)
RBC: 4.31 Mil/uL (ref 3.87–5.11)
RDW: 12.2 % (ref 11.5–15.5)
WBC: 9.3 10*3/uL (ref 4.0–10.5)

## 2014-05-06 LAB — BASIC METABOLIC PANEL
BUN: 13 mg/dL (ref 6–23)
CALCIUM: 8.7 mg/dL (ref 8.4–10.5)
CHLORIDE: 101 meq/L (ref 96–112)
CO2: 27 mEq/L (ref 19–32)
CREATININE: 0.6 mg/dL (ref 0.4–1.2)
GFR: 106.15 mL/min (ref >60.00)
Glucose, Bld: 110 mg/dL — ABNORMAL HIGH (ref 70–99)
Potassium: 4.2 mEq/L (ref 3.5–5.1)
Sodium: 134 mEq/L — ABNORMAL LOW (ref 135–145)

## 2014-05-06 NOTE — Telephone Encounter (Signed)
New message    Patient came in the office today for lab work & [facility] for stress test. Was told instruction sheet will be waiting for her.  Patient is asking for a call back.

## 2014-05-06 NOTE — Telephone Encounter (Signed)
Will have Melissa PalmDebra M print and give to her at the [facility].  Spoke to ChapinDebra and she is going to print for patient

## 2014-05-06 NOTE — Telephone Encounter (Signed)
Walk In pt Form " FMLA" Dropped Off sent to [employer] 8.5.15/km

## 2014-05-06 NOTE — Procedures (Signed)
Exercise Treadmill Test  Pre-Exercise Testing Evaluation   Test  Exercise Tolerance Test Ordering MD: Olga MillersBrian Crenshaw, MD    Unique Test No: 1   Treadmill:  1  Indication for ETT: chest pain - rule out ischemia  Contraindication to ETT: No   Stress Modality: exercise - treadmill  Cardiac Imaging Performed: non   Protocol: standard Bruce - maximal  Max BP:  156/80  Max MPHR (bpm):  174 85% MPR (bpm):  147  MPHR obtained (bpm):  155 % MPHR obtained:  89  Reached 85% MPHR (min:sec):  6:35 Total Exercise Time (min-sec):  7  Workload in METS:  8.5 Borg Scale: 15  Reason ETT Terminated:  dyspnea    ST Segment Analysis At Rest: normal ST segments - no evidence of significant ST depression With Exercise: no evidence of significant ST depression  Other Information Arrhythmia:  No Angina during ETT:  absent (0) Quality of ETT:  diagnostic  ETT Interpretation:  normal - no evidence of ischemia by ST analysis  Comments: The patient had a good exercise tolerance.  There was no chest pain.  There was an appropriate level of dyspnea.  There were no arrhythmias, a normal heart rate response and normal BP response.  There were no ischemic ST T wave changes and a normal heart rate recovery.  The Duke Score was 4 with a moderate risk category.  Recommendations: Further plans per Dr. Jens Somrenshaw.

## 2014-05-12 ENCOUNTER — Encounter (HOSPITAL_COMMUNITY): Payer: Self-pay | Admitting: General Practice

## 2014-05-12 ENCOUNTER — Ambulatory Visit (HOSPITAL_COMMUNITY)
Admission: RE | Admit: 2014-05-12 | Discharge: 2014-05-12 | Disposition: A | Discharge status: Home / Self Care | Payer: BC Managed Care – PPO | Source: Ambulatory Visit | Attending: Internal Medicine | Admitting: Internal Medicine

## 2014-05-12 ENCOUNTER — Encounter (HOSPITAL_COMMUNITY)
Admission: RE | Disposition: A | Discharge status: Home / Self Care | Payer: Self-pay | Source: Ambulatory Visit | Attending: Internal Medicine

## 2014-05-12 DIAGNOSIS — I471 Supraventricular tachycardia: Secondary | ICD-10-CM

## 2014-05-12 DIAGNOSIS — E785 Hyperlipidemia, unspecified: Secondary | ICD-10-CM | POA: Insufficient documentation

## 2014-05-12 DIAGNOSIS — E890 Postprocedural hypothyroidism: Secondary | ICD-10-CM | POA: Diagnosis not present

## 2014-05-12 DIAGNOSIS — I498 Other specified cardiac arrhythmias: Secondary | ICD-10-CM | POA: Diagnosis not present

## 2014-05-12 HISTORY — PX: SUPRAVENTRICULAR TACHYCARDIA ABLATION: SHX5492

## 2014-05-12 HISTORY — DX: Nausea with vomiting, unspecified: R11.2

## 2014-05-12 HISTORY — DX: Gastro-esophageal reflux disease without esophagitis: K21.9

## 2014-05-12 HISTORY — PX: ABLATION OF DYSRHYTHMIC FOCUS: SHX254

## 2014-05-12 HISTORY — DX: Cardiac arrhythmia, unspecified: I49.9

## 2014-05-12 HISTORY — DX: Other complications of anesthesia, initial encounter: T88.59XA

## 2014-05-12 HISTORY — DX: Other specified postprocedural states: Z98.890

## 2014-05-12 HISTORY — DX: Adverse effect of unspecified anesthetic, initial encounter: T41.45XA

## 2014-05-12 SURGERY — SUPRAVENTRICULAR TACHYCARDIA ABLATION
Anesthesia: LOCAL

## 2014-05-12 MED ORDER — BUPIVACAINE HCL (PF) 0.25 % IJ SOLN
INTRAMUSCULAR | Status: AC
  Filled 2014-05-12: qty 30

## 2014-05-12 MED ORDER — FENTANYL CITRATE 0.05 MG/ML IJ SOLN: 25.0000 ug | INTRAMUSCULAR | Status: DC | PRN

## 2014-05-12 MED ORDER — ONDANSETRON HCL 4 MG/2ML IJ SOLN: 4.0000 mg | Freq: Four times a day (QID) | INTRAMUSCULAR | Status: DC | PRN

## 2014-05-12 MED ORDER — FENTANYL CITRATE 0.05 MG/ML IJ SOLN
INTRAMUSCULAR | Status: AC
  Filled 2014-05-12: qty 2

## 2014-05-12 MED ORDER — ADULT MULTIVITAMIN W/MINERALS CH
2.0000 | ORAL_TABLET | Freq: Every day | ORAL | Status: DC
  Filled 2014-05-12: qty 2

## 2014-05-12 MED ORDER — MIDAZOLAM HCL 5 MG/5ML IJ SOLN
INTRAMUSCULAR | Status: AC
  Filled 2014-05-12: qty 5

## 2014-05-12 MED ORDER — HEPARIN (PORCINE) IN NACL 2-0.9 UNIT/ML-% IJ SOLN
INTRAMUSCULAR | Status: AC
  Filled 2014-05-12: qty 500

## 2014-05-12 MED ORDER — ALPRAZOLAM 0.25 MG PO TABS: 0.2500 mg | ORAL_TABLET | Freq: Every day | ORAL | Status: DC | PRN

## 2014-05-12 MED ORDER — IBUPROFEN 200 MG PO TABS
200.0000 mg | ORAL_TABLET | Freq: Three times a day (TID) | ORAL | Status: DC | PRN
  Filled 2014-05-12: qty 1

## 2014-05-12 MED ORDER — SODIUM CHLORIDE 0.9 % IV SOLN: 250.0000 mL | INTRAVENOUS | Status: DC | PRN

## 2014-05-12 MED ORDER — VENLAFAXINE HCL ER 37.5 MG PO CP24
37.5000 mg | ORAL_CAPSULE | Freq: Two times a day (BID) | ORAL | Status: DC
  Filled 2014-05-12: qty 1

## 2014-05-12 MED ORDER — METOPROLOL SUCCINATE ER 25 MG PO TB24
25.0000 mg | ORAL_TABLET | Freq: Every day | ORAL | Status: DC
  Administered 2014-05-12: 25 mg via ORAL
  Filled 2014-05-12: qty 1

## 2014-05-12 MED ORDER — SODIUM CHLORIDE 0.9 % IJ SOLN: 3.0000 mL | Freq: Two times a day (BID) | INTRAMUSCULAR | Status: DC

## 2014-05-12 MED ORDER — VITAMIN C 500 MG PO TABS
500.0000 mg | ORAL_TABLET | Freq: Every day | ORAL | Status: DC
  Filled 2014-05-12: qty 1

## 2014-05-12 MED ORDER — LEVOTHYROXINE SODIUM 125 MCG PO TABS
125.0000 ug | ORAL_TABLET | Freq: Every day | ORAL | Status: DC
  Filled 2014-05-12: qty 1

## 2014-05-12 MED ORDER — ACETAMINOPHEN 325 MG PO TABS: 650.0000 mg | ORAL_TABLET | ORAL | Status: DC | PRN

## 2014-05-12 MED ORDER — SODIUM CHLORIDE 0.9 % IJ SOLN
3.0000 mL | INTRAMUSCULAR | Status: DC | PRN
Start: 2014-05-12 — End: 2014-05-12

## 2014-05-12 NOTE — Progress Notes (Signed)
UR Completed Donnia Poplaski Graves-Bigelow, RN,BSN [phone]  

## 2014-05-12 NOTE — Interval H&P Note (Signed)
History and Physical Interval Note:  05/12/2014 8:02 AM  Melissa RidgesHolly P Holtzclaw  has presented today for surgery, with the diagnosis of svt  The various methods of treatment have been discussed with the patient and family. After consideration of risks, benefits and other options for treatment, the patient has consented to  Procedure(s): SUPRAVENTRICULAR TACHYCARDIA ABLATION (N/A) as a surgical intervention .  The patient's history has been reviewed, patient examined, no change in status, stable for surgery.  I have reviewed the patient's chart and labs.  Questions were answered to the patient's satisfaction.     Leonia ReevesGregg Taylor,M.D.

## 2014-05-12 NOTE — Progress Notes (Signed)
Order for sheath removal verified per post procedural orders. Procedure explained to patient and Rt femoral artery and  right venous.  7 French sheath removed for Internal Jugular.  Each  access site assessed: level 0, palpable dorsalis pedis and posterior tibial pulses. Two  6 [place] Sheath, one 8 [place]  removed and manual pressure applied for 20  minutes. Pre, peri, & post procedural vitals: HR  82, RR 17, O2 Sat  98% RA, BP 113/84, Pain 0. Distal pulses remained intact after sheath removal. Access site level 0 and dressed with 4X4 gauze and tegaderm. Otelia SanteeFarrah, RN confirmed condition of site. Post procedural instructions discussed with return demonstration from patient.   [place] [facility] [place] [facility].

## 2014-05-12 NOTE — CV Procedure (Signed)
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 After the usual preparation and draping, intravenous Versed and fentanyl were used for sedation. A 6 French hexapolar catheter was inserted percutaneously into the right jugular vein and advanced to the coronary sinus. A 6 French quadripolar catheter was inserted percutaneously into the right femoral vein and advanced to the right ventricle. A 6 French quadripolar catheter was inserted percutaneously into the right femoral vein and advanced to the His bundle region. After measurement of the basic intervals, rapid ventricular pacing was carried out from the right ventricle at a pacing cycle length of 600 ms, and stepwise decreased down to 330 ms where VA block was demonstrated. During rapid ventricular pacing, the atrial activation was midline and decremental. Next programmed ventricular stimulation was carried out from the right ventricle at a pacing cycle length of 500 ms. The S1-S2 interval was stepwise decreased from 440 ms down to 240 ms where ventricular refractoriness was demonstrated. During programmed ventricular stimulation, atrial activation sequence was midline and decremental. Next programmed atrial stimulation was carried out from the coronary sinus at a pacing cycle length of 500 ms. S1-S2 interval was stepwise decreased down to 280 ms resulting in initiation of SVT. The cycle length was 340 ms. Mapping of the tachycardia demonstrated a very short VA interval. The atrial activation was midline. PVCs placed at the time of His bundle refractoriness did not preexcite the atrium. Ventricular pacing during tachycardia demonstrated a VAV activation sequence. At  other times, ventricular pacing would terminate the tachycardia. A diagnosis of AV node reentrant tachycardia was made. Rapid atrial pacing was then carried out from the coronary sinus at a pacing cycle length of 600 ms and stepwise decreased down to 330 ms were SVT was again initiated. At this point a 7 [place] quadripolar ablation catheter was inserted percutaneously into the right femoral vein and advanced under fluoroscopic guidance to the right atrium. Mapping of the region between the tricuspid valve annulus, the AV node, and the coronary sinus was carried out. This region was smaller than usual. 2 radiofrequency energy applications were then delivered to the slow pathway region. Accelerated junctional rhythm was demonstrated. Following ablation, additional rapid atrial pacing and programmed atrial stimulation was carried out, and there was no inducible SVT, and there is no evidence of any residual slow pathway conduction. There were no AH jumps, and a single echo beat was observed. The patient was observed for 30 minutes. At the end of this time with no inducible SVT, the catheters were removed, and the patient was returned to the recovery area for sheath removal.  Complications: There were no immediate procedural complications.  Results. A. Baseline ECG. The baseline ECG demonstrated normal sinus rhythm. B. Baseline intervals. The sinus node cycle length was 859 ms. The HV interval was 37 ms, and the AH interval was 70 ms. The QRS duration was 98 ms. Following ablation, there is no significant change in the baseline intervals. C. Rapid ventricular pacing. Rapid ventricular pacing was carried out from the right ventricle and stepwise decreased down to 330 ms where VA block was observed. During rapid ventricular pacing, the atrial activation sequence was midline and decremental. There is no inducible SVT. D. Programmed ventricular stimulation. Programmed ventricular stimulation was carried out from the  right ventricle at a pacing cycle length of 500 ms. S1-S2 interval was stepwise decreased down to 240 ms where ventricular refractoriness was observed. During programmed ventricular stimulation, the atrial activation sequence was midline and decremental. There were no inducible SVT. E. Programmed atrial stimulation. Programmed atrial stimulation was carried out from the atrium at a pacing cycle length of 500 ms. The S1-S2 interval was stepwise decreased from 440 ms down to 350 ms were SVT was initiated. Following ablation, S1-S2 interval was stepwise decreased down to 270 ms atrial refractoriness was observed. During programmed atrial stimulation following ablation, there were no AH jumps, a single echo beat, and no inducible SVT. F. Rapid atrial pacing. Rapid atrial pacing was carried out from the atrium at a pacing cycle of 600 ms. Pacing cycle length was stepwise decreased down to 330 ms, resulting in the initiation of SVT. Following catheter ablation, additional rapid atrial pacing was carried out resulting in no inducible SVT. Following ablation, the PR interval was less than the RR interval. Following ablation, AV block was demonstrated at a pacing cycle length of 370 ms.  G. arrhythmias observed. AV node reentrant tachycardia, cycle length 338 ms, method of initiation was with rapid atrial pacing and programmed atrial stimulation, duration sustained, method of termination was with rapid ventricular pacing. H. Mapping mapping of Koch's triangle demonstrated an unusually small posterior triangle. I. Radiofrequency energy application. 2 radiofrequency energy applications were delivered resulting in accelerated junctional rhythm. Radiofrequency was delivered in the region between the coronary sinus ostium and the tricuspid valve annulus. Following radiofrequency energy application, there is no inducible SVT.  Conclusion. Successful electrophysiology study and catheter ablation of AV node reentrant  tachycardia with 2 radiofrequency energy applications delivered to the slow pathway, resulting in accelerated junctional rhythm, and rendering the SVT noninducible, despite minimal residual slow pathway conduction.  Lewayne BuntingGregg Taylor, M.D.

## 2014-05-12 NOTE — H&P (View-Only) (Signed)
HPI Melissa Levy is referred today by Dr. Jens Somrenshaw for evaluationof SVT. She is a pleasant 48 yo woman with a 15 year history of tachypalpitations who has had documented SVT at 175/min, which persisted such that she presented to the hospital with SVT and underwent infusion of Adenosine. In the interim she was started on metoprolol. She has multiple episodes in the past which can usually be terminated with valsalva maneuvers. Her episodes are associated with chest pressure, nausea, and diaphoresis. Allergies  Allergen Reactions  . Dilaudid [Hydromorphone Hcl]      Current Outpatient Prescriptions  Medication Sig Dispense Refill  . ALPRAZolam (XANAX) 0.25 MG tablet Take 0.25 mg by mouth as needed.       . Black Cohosh 540 MG CAPS Take 540 mg by mouth daily.      . Cranberry 500 MG CAPS Take 1 capsule by mouth daily.       Marland Kitchen. ibuprofen (ADVIL,MOTRIN) 200 MG tablet Take 200 mg by mouth as needed.      . magnesium oxide (MAG-OX) 400 MG tablet Take 400 mg by mouth daily.      . metoprolol succinate (TOPROL XL) 25 MG 24 hr tablet Take 1 tablet (25 mg total) by mouth daily.  30 tablet  12  . Multiple Vitamin (MULTIVITAMIN WITH MINERALS) TABS tablet Take 2 tablets by mouth daily.      . Omega 3 1000 MG CAPS Take 1 capsule by mouth daily.       Marland Kitchen. POTASSIUM CITRATE PO Take 99 mcg by mouth daily.      . ranitidine (ZANTAC) 150 MG tablet Take 150 mg by mouth daily.      . Red Yeast Rice 600 MG TABS Take 1,200 mg by mouth daily.      Marland Kitchen. SYNTHROID 125 MCG tablet Take 125 mcg by mouth daily before breakfast.       . venlafaxine XR (EFFEXOR-XR) 37.5 MG 24 hr capsule Take 37.5 mg by mouth 2 (two) times daily.       . vitamin B-12 (CYANOCOBALAMIN) 500 MCG tablet Take 500 mcg by mouth daily.      . vitamin C (ASCORBIC ACID) 500 MG tablet Take 500 mg by mouth daily.       No current facility-administered medications for this visit.     Past Medical History  Diagnosis Date  . Thyroid disease    Graves; now hypothyroid  . Hyperlipidemia   . SVT (supraventricular tachycardia)     ROS:   All systems reviewed and negative except as noted in the HPI.   Past Surgical History  Procedure Laterality Date  . Back surgery    . Abdominal hysterectomy    . Thyroidectomy       Family History  Problem Relation Age of Onset  . Heart disease Father     Atrial fibrillation; ablation     History   Social History  . Marital Status: Married    Spouse Name: N/A    Number of Children: 4  . Years of Education: N/A   Occupational History  .     Social History Main Topics  . Smoking status: Never Smoker   . Smokeless tobacco: Not on file  . Alcohol Use: Yes     Comment: Occasional  . Drug Use: No  . Sexual Activity: Not on file   Other Topics Concern  . Not on file   Social History Narrative  . No narrative on file  BP 126/84  Pulse 74  Ht 5\' 9"  (1.753 m)  Wt 225 lb 12.8 oz (102.422 kg)  BMI 33.33 kg/m2  Physical Exam:  Well appearing 48 yo woman, NAD HEENT: Unremarkable Neck:  No JVD, no thyromegally Back:  No CVA tenderness Lungs:  Clear with no wheezes HEART:  Regular rate rhythm, no murmurs, no rubs, no clicks Abd:  soft, positive bowel sounds, no organomegally, no rebound, no guarding Ext:  2 plus pulses, no edema, no cyanosis, no clubbing Skin:  No rashes no nodules Neuro:  CN II through XII intact, motor grossly intact  EKG - nsr with no pre-excitation   Assess/Plan:

## 2014-05-12 NOTE — Progress Notes (Addendum)
Reviewed discharge instructions with patient and husband and they stated their understanding.  Ambulated in hallway without complications and right groin remains stable.  Patient to followup with Dr. Ladona Ridgelaylor concerning continuation of Toprol.  Discharged home with family.  Colman Caterarpley, Aaira Oestreicher Danielle

## 2014-05-12 NOTE — Discharge Instructions (Signed)
PLEASE REMEMBER TO BRING ALL OF YOUR MEDICATIONS TO EACH OF YOUR FOLLOW-UP OFFICE VISITS.  PLEASE ATTEND ALL SCHEDULED FOLLOW-UP APPOINTMENTS.   Activity: Increase activity slowly as tolerated. You may shower, but no soaking baths (or swimming) for 1 week. No driving for 2 days. No lifting over 5 lbs for 1 week. No sexual activity for 1 week.   You May Return to Work: in 1 week (if applicable)  Wound Care: You may wash cath site gently with soap and water. Keep cath site clean and dry. If you notice pain, swelling, bleeding or pus at your cath site, please call [phone].    Cardiac Cath Site Care Refer to this sheet in the next few weeks. These instructions provide you with information on caring for yourself after your procedure. Your caregiver may also give you more specific instructions. Your treatment has been planned according to current medical practices, but problems sometimes occur. Call your caregiver if you have any problems or questions after your procedure. HOME CARE INSTRUCTIONS  You may shower 24 hours after the procedure. Remove the bandage (dressing) and gently wash the site with plain soap and water. Gently pat the site dry.   Do not apply powder or lotion to the site.   Do not sit in a bathtub, swimming pool, or whirlpool for 5 to 7 days.   No bending, squatting, or lifting anything over 10 pounds (4.5 kg) as directed by your caregiver.   Inspect the site at least twice daily.   Do not drive home if you are discharged the same day of the procedure. Have someone else drive you.   You may drive 24 hours after the procedure unless otherwise instructed by your caregiver.  What to expect:  Any bruising will usually fade within 1 to 2 weeks.   Blood that collects in the tissue (hematoma) may be painful to the touch. It should usually decrease in size and tenderness within 1 to 2 weeks.  SEEK IMMEDIATE MEDICAL CARE IF:  You have unusual pain at the site or down the  affected limb.   You have redness, warmth, swelling, or pain at the site.   You have drainage (other than a small amount of blood on the dressing).   You have chills.   You have a fever or persistent symptoms for more than 72 hours.   You have a fever and your symptoms suddenly get worse.   Your leg becomes pale, cool, tingly, or numb.   You have heavy bleeding from the site. Hold pressure on the site.  Document Released: 10/21/2010 Document Revised: 09/07/2011 Document Reviewed: 10/21/2010 [facility] Patient Information 2012 [place], [place].    Cardiac Ablation  Cardiac ablation is a procedure to stop some heart tissue from causing problems. The heart has many electrical connections. Sometimes these connections cause the heart to beat very fast or irregularly. Removing some of the problem areas can improve heart rhythm or make it normal. Ablation is done for people who:   Have Wolff-Parkinson-White syndrome.  Have other fast heart rhythms (tachycardia).  Have taken medicines for an abnormal heart rhythm (arrhythmia) and the medicines had:  No success.  Side effects.  May have a type of heartbeat that could cause death. BEFORE THE PROCEDURE   Follow instructions from your doctor about eating and drinking before the procedure.  Take your medicines as told by your doctor. Take them at regular times with water unless told differently by your doctor.  If you are taking  diabetes medicine, ask your doctor how to take it. Ask if there are any special instructions you should follow. Your doctor may change how much insulin you take the day of the procedure. PROCEDURE   A special type of X-ray will be used. The X-ray helps your doctor see images of your heart during the procedure.  A small cut (incision) will be made in your neck or groin.  An IV tube will be started before the procedure begins.  You will be given a numbing medicine (anesthetic) or a medicine to help you relax  (sedative).  The skin on your neck or groin will be numbed.  A needle will be put into a large vein in your neck or groin.  A thin, flexible tube (catheter) will be put in to reach your heart.  A dye will be put in the tube. The dye will show up on X-rays. It will help your doctor see the area of the heart that needs treatment.  When the heart tissue that is causing problems is found, the tip of the tube will send an electrical current to it. This will stop it from causing problems.  The tube will be taken out.  Pressure will be put on the area where the tube was. This will keep it from bleeding. A bandage will be placed over the area. AFTER THE PROCEDURE  You will be taken to a recovery area. Your blood pressure, heart rate, and breathing will be watched. The area where the tube was will also be watched for bleeding.  You will need to lie still for 4-6 hours. This keeps the area where the tube was from bleeding. Document Released: 05/21/2013 Document Revised: 02/02/2014 Document Reviewed: 05/21/2013 [facility] Patient Information 2015 [place]. This information is not intended to replace advice given to you by your health care provider. Make sure you discuss any questions you have with your health care provider.

## 2014-05-13 ENCOUNTER — Telehealth: Payer: Self-pay | Admitting: Internal Medicine

## 2014-05-13 ENCOUNTER — Telehealth: Payer: Self-pay | Admitting: Cardiology

## 2014-05-13 NOTE — Telephone Encounter (Signed)
New message    Patient calling had ablation on yesterday .    Dr. Ladona Ridgelaylor spoke with family stated she did not need to talk any medication     Discharge instruction is stating something different.

## 2014-05-13 NOTE — Telephone Encounter (Signed)
Left pt a message that Per Dr Ladona Ridgelaylor: she can stop taking Toprol XL 25 mg medication.

## 2014-05-13 NOTE — Telephone Encounter (Signed)
If pt has fu with GT, no need to see me. Otherwise, should see me following ablation of SVT Melissa MillersBrian Delesia Martinek

## 2014-05-13 NOTE — Telephone Encounter (Signed)
Will forward for dr crenshaw review  

## 2014-05-13 NOTE — Telephone Encounter (Signed)
Pt called to clarify  A medication issue. Pt had an ablation yesterday. Pt states that Dr. Ladona Ridgelaylor tolled her family that she did not needed to continue taking the Toprol XL 25 mg daily. Pt was D/C home with that medication. Pt would like to know if she needs to continue taking  It, and  if she does, can she takes  a lower dose instead. Pt is aware that this message will send to Md for recommendations.

## 2014-05-13 NOTE — Telephone Encounter (Signed)
Left pt a message to call back. 

## 2014-05-13 NOTE — Telephone Encounter (Signed)
Spoke with pt, Aware of dr crenshaw's recommendations.  °

## 2014-05-13 NOTE — Telephone Encounter (Signed)
New problem   Pt want to know since she had ablation w/Dr Ladona Ridgelaylor do she still need to come in to see Dr Jens Somrenshaw. Please advise pt.

## 2014-05-21 ENCOUNTER — Ambulatory Visit: Payer: BC Managed Care – PPO | Admitting: Cardiology

## 2014-05-26 ENCOUNTER — Institutional Professional Consult (permissible substitution): Payer: BC Managed Care – PPO | Admitting: Internal Medicine

## 2014-06-12 ENCOUNTER — Ambulatory Visit (INDEPENDENT_AMBULATORY_CARE_PROVIDER_SITE_OTHER): Payer: BC Managed Care – PPO | Admitting: Internal Medicine

## 2014-06-12 ENCOUNTER — Encounter: Payer: Self-pay | Admitting: Internal Medicine

## 2014-06-12 VITALS — BP 114/72 | HR 85 | Ht 69.0 in | Wt 231.8 lb

## 2014-06-12 DIAGNOSIS — I471 Supraventricular tachycardia: Secondary | ICD-10-CM

## 2014-06-12 DIAGNOSIS — G4730 Sleep apnea, unspecified: Secondary | ICD-10-CM

## 2014-06-12 DIAGNOSIS — R400 Somnolence: Secondary | ICD-10-CM

## 2014-06-12 DIAGNOSIS — I498 Other specified cardiac arrhythmias: Secondary | ICD-10-CM

## 2014-06-12 DIAGNOSIS — G4733 Obstructive sleep apnea (adult) (pediatric): Secondary | ICD-10-CM | POA: Insufficient documentation

## 2014-06-12 DIAGNOSIS — G4710 Hypersomnia, unspecified: Secondary | ICD-10-CM

## 2014-06-12 NOTE — Progress Notes (Signed)
HPI Melissa Levy returns today for followup. She is a pleasant 48 yo woman with a h/o SVT who underwent catheter ablation several weeks ago. She has done well in the interim with no recurrent chest pain or palpitations. No other symptoms except she notes that she has had problems with snoring which she states her family notices at night. She has daytime somnolence.  Allergies  Allergen Reactions  . Dilaudid [Hydromorphone Hcl] Hives and Itching     Current Outpatient Prescriptions  Medication Sig Dispense Refill  . ALPRAZolam (XANAX) 0.25 MG tablet Take 0.25 mg by mouth daily as needed for anxiety.       . Black Cohosh 540 MG CAPS Take 540 mg by mouth daily.      . Cranberry 500 MG CAPS Take 500 mg by mouth daily.       Marland Kitchen ibuprofen (ADVIL,MOTRIN) 200 MG tablet Take 200 mg by mouth every 8 (eight) hours as needed for headache or mild pain.       . magnesium oxide (MAG-OX) 400 MG tablet Take 400 mg by mouth daily.      . metoprolol succinate (TOPROL XL) 25 MG 24 hr tablet Take 1 tablet (25 mg total) by mouth daily.  30 tablet  12  . Multiple Vitamin (MULTIVITAMIN WITH MINERALS) TABS tablet Take 2 tablets by mouth daily.      . Omega 3 1000 MG CAPS Take 1,000 mg by mouth daily.       Marland Kitchen POTASSIUM CITRATE PO Take 99 mcg by mouth daily.      . ranitidine (ZANTAC) 150 MG tablet Take 150 mg by mouth daily.      . Red Yeast Rice 600 MG TABS Take 1,200 mg by mouth daily.      Marland Kitchen SYNTHROID 125 MCG tablet Take 125 mcg by mouth daily before breakfast.       . venlafaxine XR (EFFEXOR-XR) 37.5 MG 24 hr capsule Take 37.5 mg by mouth 2 (two) times daily.       . vitamin B-12 (CYANOCOBALAMIN) 500 MCG tablet Take 500 mcg by mouth daily.      . vitamin C (ASCORBIC ACID) 500 MG tablet Take 500 mg by mouth daily.       No current facility-administered medications for this visit.     Past Medical History  Diagnosis Date  . Thyroid disease     Graves; now hypothyroid  . Hyperlipidemia   . SVT  (supraventricular tachycardia)   . Complication of anesthesia   . PONV (postoperative nausea and vomiting)   . Dysrhythmia     HX OF SVT  . GERD (gastroesophageal reflux disease)     ROS:   All systems reviewed and negative except as noted in the HPI.   Past Surgical History  Procedure Laterality Date  . Back surgery    . Abdominal hysterectomy    . Thyroidectomy    . Ablation of dysrhythmic focus  05/12/2014    DR Ladona Ridgel     Family History  Problem Relation Age of Onset  . Heart disease Father     Atrial fibrillation; ablation     History   Social History  . Marital Status: Married    Spouse Name: N/A    Number of Children: 4  . Years of Education: N/A   Occupational History  .     Social History Main Topics  . Smoking status: Never Smoker   . Smokeless tobacco: Never Used  .  Alcohol Use: Yes     Comment: Occasional  . Drug Use: No  . Sexual Activity: Not on file   Other Topics Concern  . Not on file   Social History Narrative  . No narrative on file     BP 114/72  Pulse 85  Ht  (1.753 m)  Wt 231 lb 12.8 oz (105.144 kg)  BMI 34.22 kg/m2  Physical Exam:  Well appearing NAD HEENT: Unremarkable Neck:  No JVD, no thyromegally Lymphatics:  No adenopathy Back:  No CVA tenderness Lungs:  Clear HEART:  Regular rate rhythm, no murmurs, no rubs, no clicks Abd:  soft, positive bowel sounds, no organomegally, no rebound, no guarding Ext:  2 plus pulses, no edema, no cyanosis, no clubbing Skin:  No rashes no nodules Neuro:  CN II through XII intact, motor grossly intact  EKG - NSR  Assess/Plan:

## 2014-06-12 NOTE — Assessment & Plan Note (Signed)
She is s/p ablation and doing well. I have recommended a period of watchful waiting.

## 2014-06-12 NOTE — Assessment & Plan Note (Signed)
Her symptoms are consistent with sleep apnea and she is overweight. I have recommended she proceed with a sleep study. Additional followup will be based on the results.

## 2014-06-12 NOTE — Patient Instructions (Signed)
Your physician recommends that you schedule a follow-up appointment AS NEEDED.  Your physician has recommended that you have a sleep study. This test records several body functions during sleep, including: brain activity, eye movement, oxygen and carbon dioxide blood levels, heart rate and rhythm, breathing rate and rhythm, the flow of air through your mouth and nose, snoring, body muscle movements, and chest and belly movement.

## 2014-06-30 ENCOUNTER — Other Ambulatory Visit: Payer: Self-pay

## 2014-06-30 DIAGNOSIS — Z1231 Encounter for screening mammogram for malignant neoplasm of breast: Secondary | ICD-10-CM

## 2014-07-06 ENCOUNTER — Ambulatory Visit
Admission: RE | Admit: 2014-07-06 | Discharge: 2014-07-06 | Disposition: A | Discharge status: Home / Self Care | Payer: BC Managed Care – PPO | Source: Ambulatory Visit

## 2014-07-06 DIAGNOSIS — Z1231 Encounter for screening mammogram for malignant neoplasm of breast: Secondary | ICD-10-CM

## 2014-07-07 ENCOUNTER — Ambulatory Visit (HOSPITAL_BASED_OUTPATIENT_CLINIC_OR_DEPARTMENT_OTHER): Payer: BC Managed Care – PPO | Attending: Pulmonary Disease

## 2014-07-07 VITALS — Ht 69.0 in | Wt 230.0 lb

## 2014-07-07 DIAGNOSIS — G4730 Sleep apnea, unspecified: Secondary | ICD-10-CM | POA: Diagnosis present

## 2014-07-07 DIAGNOSIS — G4733 Obstructive sleep apnea (adult) (pediatric): Secondary | ICD-10-CM | POA: Insufficient documentation

## 2014-07-07 DIAGNOSIS — R400 Somnolence: Secondary | ICD-10-CM

## 2014-07-07 DIAGNOSIS — I493 Ventricular premature depolarization: Secondary | ICD-10-CM | POA: Insufficient documentation

## 2014-07-10 DIAGNOSIS — G4733 Obstructive sleep apnea (adult) (pediatric): Secondary | ICD-10-CM

## 2014-07-10 NOTE — Sleep Study (Signed)
   NAME: Melissa Levy DATE OF BIRTH:  05/05/1966 MEDICAL RECORD NUMBER [id number]  LOCATION: [facility]  PHYSICIAN: Barbaraann ShareCLANCE,KEITH M  DATE OF STUDY: 07/07/2014  SLEEP STUDY TYPE: Nocturnal Polysomnogram               REFERRING PHYSICIAN: Marinus Mawaylor, Gregg W, MD  INDICATION FOR STUDY: Hypersomnia with sleep apnea  EPWORTH SLEEPINESS SCORE:  10 HEIGHT: 5\' 9"  (175.3 cm)  WEIGHT: 230 lb (104.327 kg)    Body mass index is 33.95 kg/(m^2).  NECK SIZE: 15 in.  MEDICATIONS: Reviewed in the medical record  SLEEP ARCHITECTURE: The patient had a total sleep time of 309 minutes, with no slow-wave sleep and only 49 minutes of REM. Sleep onset latency was normal at 26 minutes, and REM onset was prolonged at 209 minutes. Sleep efficiency was mildly decreased at 83%.  RESPIRATORY DATA: The patient was found to have 19 apneas and 46 obstructive hypopneas, giving her an AHI of 13 events per hour. The events occurred in all body positions, but were more prominent during REM.  There was loud snoring noted throughout. The patient did not meet split-night criteria secondary to her decreased numbers of events, and the majority of which occurred after 1 AM.  OXYGEN DATA: The patient had oxygen desaturation as low as 81% with her obstructive events  CARDIAC DATA: PVCs were noted throughout the night  MOVEMENT/PARASOMNIA: The patient had no significant limb movements or other abnormal behaviors.  IMPRESSION/ RECOMMENDATION:    1) mild obstructive sleep apnea/hypopnea syndrome, with an AHI of 13 events per hour and oxygen desaturation as low as 81%. Treatment for this degree of sleep apnea can include a trial of weight loss alone, upper airway surgery, dental, and also CPAP. Clinical correlation is suggested.  2) PVCs noted throughout the night, but no obvious clinically significant arrhythmia was present.    Barbaraann ShareLANCE,KEITH M Diplomate, American Board of Sleep Medicine  ELECTRONICALLY SIGNED  ON:  07/10/2014, 5:17 PM [facility] PH: [phone]   FX: [phone]) [phone] ACCREDITED BY THE AMERICAN ACADEMY OF SLEEP MEDICINE

## 2014-07-28 ENCOUNTER — Telehealth: Payer: Self-pay | Admitting: Internal Medicine

## 2014-07-28 DIAGNOSIS — G4730 Sleep apnea, unspecified: Secondary | ICD-10-CM

## 2014-07-28 NOTE — Telephone Encounter (Signed)
Called patient with sleep study results. Gave her prelim results. Advised report read by Dr.Clance but has not been reviewed by Dr.Taylor yet. She would like report sent to PCP ( Dr. Sherlyn Lickedmon). Advised will send after DrTaylor reviews report. Will forward message to Dr.Taylor and Dennis BastKelly Lanier RN.

## 2014-07-28 NOTE — Telephone Encounter (Signed)
New message     Want results of sleep study

## 2014-08-04 ENCOUNTER — Encounter (HOSPITAL_BASED_OUTPATIENT_CLINIC_OR_DEPARTMENT_OTHER): Payer: BC Managed Care – PPO

## 2014-08-14 NOTE — Telephone Encounter (Signed)
Follow up  ° ° ° °Returning call back to nurse  °

## 2014-08-14 NOTE — Telephone Encounter (Signed)
Left message on machine for patient. Dr. Ladona Ridgelaylor reviewed and wants her to be referred to pulmonary. I have left this on her voice mail

## 2014-08-14 NOTE — Telephone Encounter (Signed)
She is aware that someone will call her to schedule

## 2014-09-10 ENCOUNTER — Encounter (HOSPITAL_COMMUNITY): Payer: Self-pay | Admitting: Internal Medicine

## 2014-09-29 ENCOUNTER — Institutional Professional Consult (permissible substitution): Payer: BC Managed Care – PPO | Admitting: Pulmonary Disease

## 2014-10-08 ENCOUNTER — Encounter: Payer: Self-pay | Admitting: Pulmonary Disease

## 2014-10-16 ENCOUNTER — Institutional Professional Consult (permissible substitution): Payer: BC Managed Care – PPO | Admitting: Pulmonary Disease

## 2014-11-16 ENCOUNTER — Institutional Professional Consult (permissible substitution): Payer: Self-pay | Admitting: Pulmonary Disease

## 2014-12-08 ENCOUNTER — Emergency Department (HOSPITAL_COMMUNITY)
Admission: EM | Admit: 2014-12-08 | Discharge: 2014-12-09 | Disposition: A | Discharge status: Home / Self Care | Payer: BLUE CROSS/BLUE SHIELD | Attending: Emergency Medicine | Admitting: Emergency Medicine

## 2014-12-08 ENCOUNTER — Encounter (HOSPITAL_COMMUNITY): Payer: Self-pay | Admitting: Emergency Medicine

## 2014-12-08 DIAGNOSIS — Z8639 Personal history of other endocrine, nutritional and metabolic disease: Secondary | ICD-10-CM | POA: Diagnosis not present

## 2014-12-08 DIAGNOSIS — Z79899 Other long term (current) drug therapy: Secondary | ICD-10-CM | POA: Diagnosis not present

## 2014-12-08 DIAGNOSIS — Z8679 Personal history of other diseases of the circulatory system: Secondary | ICD-10-CM | POA: Insufficient documentation

## 2014-12-08 DIAGNOSIS — G44209 Tension-type headache, unspecified, not intractable: Secondary | ICD-10-CM | POA: Diagnosis not present

## 2014-12-08 DIAGNOSIS — K219 Gastro-esophageal reflux disease without esophagitis: Secondary | ICD-10-CM | POA: Insufficient documentation

## 2014-12-08 DIAGNOSIS — R51 Headache: Secondary | ICD-10-CM | POA: Diagnosis present

## 2014-12-08 MED ORDER — DIAZEPAM 5 MG/ML IJ SOLN
5.0000 mg | Freq: Once | INTRAMUSCULAR | Status: AC
  Administered 2014-12-09: 5 mg via INTRAVENOUS
  Filled 2014-12-08: qty 2

## 2014-12-08 MED ORDER — KETOROLAC TROMETHAMINE 30 MG/ML IJ SOLN
30.0000 mg | Freq: Once | INTRAMUSCULAR | Status: AC
  Administered 2014-12-09: 30 mg via INTRAVENOUS
  Filled 2014-12-08: qty 1

## 2014-12-08 MED ORDER — ONDANSETRON HCL 4 MG/2ML IJ SOLN
4.0000 mg | Freq: Once | INTRAMUSCULAR | Status: AC
  Administered 2014-12-09: 4 mg via INTRAVENOUS
  Filled 2014-12-08: qty 2

## 2014-12-08 MED ORDER — SODIUM CHLORIDE 0.9 % IV BOLUS (SEPSIS)
1000.0000 mL | Freq: Once | INTRAVENOUS | Status: AC
  Administered 2014-12-09: 1000 mL via INTRAVENOUS

## 2014-12-08 NOTE — ED Notes (Signed)
Pt c/o headache x one week. Pt states she started crestor a week ago.

## 2014-12-08 NOTE — ED Provider Notes (Signed)
TIME SEEN: 11:40PM  CHIEF COMPLAINT: Headache  HPI Comments: Melissa Levy is a 49 y.o. female with a history of migraines, hypothyroid and SVT who presents to the Emergency Department complaining of a severe headache for the last week. Pt states that her HA wakes her up in the middle of the night and feels like a "dagger." She reports associated photophobia and says certain positions such as sitting up worsens her pain (like turning her head). Pt states that she finished Abx in late February for a URI but has not had any recent infectious symptoms or fever. Pt says that she was recently started on Maxalt and flexeril by her PCP on yesterday. She says she stopped taking Crestor because she believed it may have been contributing to her HA. Pt states that she received a toradol shot by her PCP as well which helped her HA before it wore off. She states she has tried ibuprofen and oxycodone which has provided very minimal pain relief. Pt denies fever, vomiting, diarrhea, dental pain, numbness or tingling, focal weakness as symptoms.  States that this feels similar to her migraines and a sense that this is how her migraines start but has not progressed into a full migraine.  PCP Dr. Sherlyn Lick  ROS: See HPI Constitutional: no fever  Eyes: no drainage  ENT: no runny nose   Cardiovascular:  no chest pain  Resp: no SOB  GI: no vomiting GU: no dysuria Integumentary: no rash  Allergy: no hives  Musculoskeletal: no leg swelling  Neurological: no slurred speech ROS otherwise negative  PAST MEDICAL HISTORY/PAST SURGICAL HISTORY:  Past Medical History  Diagnosis Date  . Thyroid disease     Graves; now hypothyroid  . Hyperlipidemia   . SVT (supraventricular tachycardia)   . Complication of anesthesia   . PONV (postoperative nausea and vomiting)   . Dysrhythmia     HX OF SVT  . GERD (gastroesophageal reflux disease)     MEDICATIONS:  Prior to Admission medications   Medication Sig Start Date End  Date Taking? Authorizing Provider  ALPRAZolam Prudy Feeler) 0.25 MG tablet Take 0.25 mg by mouth daily as needed for anxiety.  02/09/14   Historical Provider, MD  Black Cohosh 540 MG CAPS Take 540 mg by mouth daily.    Historical Provider, MD  Cranberry 500 MG CAPS Take 500 mg by mouth daily.     Historical Provider, MD  ibuprofen (ADVIL,MOTRIN) 200 MG tablet Take 200 mg by mouth every 8 (eight) hours as needed for headache or mild pain.     Historical Provider, MD  magnesium oxide (MAG-OX) 400 MG tablet Take 400 mg by mouth daily.    Historical Provider, MD  metoprolol succinate (TOPROL XL) 25 MG 24 hr tablet Take 1 tablet (25 mg total) by mouth daily. 04/22/14   Lewayne Bunting, MD  Multiple Vitamin (MULTIVITAMIN WITH MINERALS) TABS tablet Take 2 tablets by mouth daily.    Historical Provider, MD  Omega 3 1000 MG CAPS Take 1,000 mg by mouth daily.     Historical Provider, MD  POTASSIUM CITRATE PO Take 99 mcg by mouth daily.    Historical Provider, MD  ranitidine (ZANTAC) 150 MG tablet Take 150 mg by mouth daily.    Historical Provider, MD  Red Yeast Rice 600 MG TABS Take 1,200 mg by mouth daily.    Historical Provider, MD  SYNTHROID 125 MCG tablet Take 125 mcg by mouth daily before breakfast.  04/16/14   Historical Provider, MD  venlafaxine XR (EFFEXOR-XR) 37.5 MG 24 hr capsule Take 37.5 mg by mouth 2 (two) times daily.  02/15/14   Historical Provider, MD  vitamin B-12 (CYANOCOBALAMIN) 500 MCG tablet Take 500 mcg by mouth daily.    Historical Provider, MD  vitamin C (ASCORBIC ACID) 500 MG tablet Take 500 mg by mouth daily.    Historical Provider, MD    ALLERGIES:  Allergies  Allergen Reactions  . Dilaudid [Hydromorphone Hcl] Hives and Itching    SOCIAL HISTORY:  History  Substance Use Topics  . Smoking status: Never Smoker   . Smokeless tobacco: Never Used  . Alcohol Use: Yes     Comment: Occasional    FAMILY HISTORY: Family History  Problem Relation Age of Onset  . Heart disease Father      Atrial fibrillation; ablation    EXAM: BP 122/90 mmHg  Pulse 100  Temp(Src) 98.7 F (37.1 C)  Resp 17  Ht 5\' 9"  (1.753 m)  Wt 230 lb (104.327 kg)  BMI 33.95 kg/m2  SpO2 98% CONSTITUTIONAL: Alert and oriented and responds appropriately to questions. Well-appearing; well-nourished HEAD: Normocephalic EYES: Conjunctivae clear, PERRL. Photophobia.  ENT: normal nose; no rhinorrhea; moist mucous membranes; pharynx without lesions noted. NECK: Supple, no meningismus, no LAD. TTP at the base of the occiput worse on the left with associated muscle spasm.  CARD: RRR; S1 and S2 appreciated; no murmurs, no clicks, no rubs, no gallops RESP: Normal chest excursion without splinting or tachypnea; breath sounds clear and equal bilaterally; no wheezes, no rhonchi, no rales,  ABD/GI: Normal bowel sounds; non-distended; soft, non-tender, no rebound, no guarding BACK:  The back appears normal and is non-tender to palpation, there is no CVA tenderness EXT: Normal ROM in all joints; non-tender to palpation; no edema; normal capillary refill; no cyanosis    SKIN: Normal color for age and race; warm NEURO: Moves all extremities equally. Strength 5/5 in all 4 extremities. Sensation to light touch intact diffuselly. Cranial nerves 2-12 intact.  PSYCH: The patient's mood and manner are appropriate. Grooming and personal hygiene are appropriate.  MEDICAL DECISION MAKING: Patient here with what appears to be a tension headache. She is very tender to palpation over the left occiput and has very tight musculature in this area. This worsens her headache. Will treat with Toradol, Valium.  I do not feel patient has intracranial hemorrhage, meningitis. I do not feel she needs head imaging at this time. She is hemodynamically stable and neurologically intact.  ED PROGRESS: Patient reports her headache is completely gone. We'll discharge with perception for ibuprofen, Valium, Vicodin. Have advised her to stop taking her  Flexeril while taking Valium. Have advised her to place heat to this area, stretch and massage. Discussed return precautions. Have advised PCP follow-up. She verbalized understanding is comfortable with plan. We'll discharge with work note.      I personally performed the services described in this documentation, which was scribed in my presence. The recorded information has been reviewed and is accurate.   Layla MawKristen N Fareed Fung, DO 12/09/14 [phone]

## 2014-12-09 MED ORDER — IBUPROFEN 800 MG PO TABS: 800.0000 mg | ORAL_TABLET | Freq: Three times a day (TID) | ORAL | Status: DC | PRN

## 2014-12-09 MED ORDER — DIAZEPAM 5 MG PO TABS: 5.0000 mg | ORAL_TABLET | Freq: Three times a day (TID) | ORAL | Status: DC | PRN

## 2014-12-09 MED ORDER — HYDROCODONE-ACETAMINOPHEN 5-325 MG PO TABS: 1.0000 | ORAL_TABLET | ORAL | Status: DC | PRN

## 2014-12-09 NOTE — Discharge Instructions (Signed)

## 2014-12-10 ENCOUNTER — Telehealth: Payer: Self-pay | Admitting: Internal Medicine

## 2014-12-10 NOTE — Telephone Encounter (Signed)
New message     Patient calling     1. Referral to neuro - unsure need to talk with nurse .   2. Has not been to pulmonary MD yet - appt keep getting change.

## 2014-12-10 NOTE — Telephone Encounter (Signed)
Left message on machine to call her PCP for referral and that I saw where pulmonary appointment had been changed several times.

## 2014-12-10 NOTE — ED Notes (Signed)
Patient says her headache continues--advised to follow up.  JMMc

## 2014-12-14 ENCOUNTER — Other Ambulatory Visit: Payer: Self-pay | Admitting: Physician Assistant

## 2014-12-14 DIAGNOSIS — R51 Headache: Principal | ICD-10-CM

## 2014-12-14 DIAGNOSIS — R519 Headache, unspecified: Secondary | ICD-10-CM

## 2014-12-16 ENCOUNTER — Ambulatory Visit
Admission: RE | Admit: 2014-12-16 | Discharge: 2014-12-16 | Disposition: A | Discharge status: Home / Self Care | Payer: BLUE CROSS/BLUE SHIELD | Source: Ambulatory Visit | Attending: Physician Assistant | Admitting: Physician Assistant

## 2014-12-16 DIAGNOSIS — R51 Headache: Principal | ICD-10-CM

## 2014-12-16 DIAGNOSIS — R519 Headache, unspecified: Secondary | ICD-10-CM

## 2014-12-28 ENCOUNTER — Ambulatory Visit (INDEPENDENT_AMBULATORY_CARE_PROVIDER_SITE_OTHER): Payer: BLUE CROSS/BLUE SHIELD | Admitting: Pulmonary Disease

## 2014-12-28 ENCOUNTER — Encounter: Payer: Self-pay | Admitting: Pulmonary Disease

## 2014-12-28 VITALS — BP 116/68 | HR 71 | Temp 97.8°F | Ht 69.0 in | Wt 234.6 lb

## 2014-12-28 DIAGNOSIS — G4733 Obstructive sleep apnea (adult) (pediatric): Secondary | ICD-10-CM

## 2014-12-28 NOTE — Progress Notes (Signed)
   Subjective:    Patient ID: Stephan MinisterHolly P Mcnorton, female    DOB: June 29, 1966, 49 y.o.   MRN: [id number]  HPI The patient is a 49 year old female who I've been asked to see for management of obstructive sleep apnea. She had a sleep study in October of last year that showed mild OSA, with an AHI of 13 events per hour. He has been noted to have snoring, but no one has mentioned an abnormal breathing pattern during sleep. She denies choking arousals, but has frequent awakenings during the night. She is never rested upon arising, regardless of how much she is actually slapped. She notes some sleep pressure during the day with Monday past such as Webinars or other computer work.  She has significant issues in the evenings staying awake for movies or TV shows, and has only occasional sleepiness with driving. Her weight is up about 20 pounds over the last 2 years, and her Epworth score today is 12   Review of Systems  Constitutional: Negative for fever and unexpected weight change.  HENT: Negative for congestion, dental problem, ear pain, nosebleeds, postnasal drip, rhinorrhea, sinus pressure, sneezing, sore throat and trouble swallowing.   Eyes: Negative for redness and itching.  Respiratory: Negative for cough, chest tightness, shortness of breath and wheezing.   Cardiovascular: Negative for palpitations and leg swelling.  Gastrointestinal: Negative for nausea and vomiting.  Genitourinary: Negative for dysuria.  Musculoskeletal: Negative for joint swelling.  Skin: Negative for rash.  Neurological: Negative for headaches.  Hematological: Does not bruise/bleed easily.  Psychiatric/Behavioral: Negative for dysphoric mood. The patient is not nervous/anxious.        Objective:   Physical Exam Constitutional:  Obese female, no acute distress  HENT:  Nares patent without discharge  Oropharynx without exudate, palate and uvula are significantly elongated.  Eyes:  Perrla, eomi, no scleral icterus  Neck:  No  JVD, no TMG  Cardiovascular:  Normal rate, regular rhythm, no rubs or gallops.  No murmurs        Intact distal pulses  Pulmonary :  Normal breath sounds, no stridor or respiratory distress   No rales, rhonchi, or wheezing  Abdominal:  Soft, nondistended, bowel sounds present.  No tenderness noted.   Musculoskeletal:  mild lower extremity edema noted.  Lymph Nodes:  No cervical lymphadenopathy noted  Skin:  No cyanosis noted  Neurologic:  Alert, appropriate, moves all 4 extremities without obvious deficit.         Assessment & Plan:

## 2014-12-28 NOTE — Assessment & Plan Note (Signed)
The patient has mild obstructive sleep apnea by her sleep study last October, and feels this is greatly impacting her sleep and quality of life during the day. I have outlined various treatment options, including a trial of weight loss alone, dental appliance, and also C Pap. After a long conversation about the positives and negatives of each one, she has decided to give C a trial. I will set the patient up on cpap at a moderate pressure level to allow for desensitization, and will troubleshoot the device over the next 4-6weeks if needed.  The pt is to call me if having issues with tolerance.  Will then optimize the pressure once patient is able to wear cpap on a consistent basis.

## 2014-12-28 NOTE — Patient Instructions (Signed)
Will start on cpap at a moderate pressure level.  Please call if having tolerance issues. Work on weight reduction over time.

## 2014-12-30 ENCOUNTER — Ambulatory Visit (INDEPENDENT_AMBULATORY_CARE_PROVIDER_SITE_OTHER): Payer: BLUE CROSS/BLUE SHIELD | Admitting: Diagnostic Neuroimaging

## 2014-12-30 ENCOUNTER — Encounter: Payer: Self-pay | Admitting: Diagnostic Neuroimaging

## 2014-12-30 VITALS — BP 120/87 | HR 91 | Temp 98.2°F | Resp 18 | Ht 69.0 in | Wt 228.0 lb

## 2014-12-30 DIAGNOSIS — M6248 Contracture of muscle, other site: Secondary | ICD-10-CM

## 2014-12-30 DIAGNOSIS — G43709 Chronic migraine without aura, not intractable, without status migrainosus: Secondary | ICD-10-CM | POA: Diagnosis not present

## 2014-12-30 DIAGNOSIS — M62838 Other muscle spasm: Secondary | ICD-10-CM

## 2014-12-30 DIAGNOSIS — G44209 Tension-type headache, unspecified, not intractable: Secondary | ICD-10-CM | POA: Diagnosis not present

## 2014-12-30 MED ORDER — TOPIRAMATE 50 MG PO TABS: 50.0000 mg | ORAL_TABLET | Freq: Two times a day (BID) | ORAL | Status: DC

## 2014-12-30 NOTE — Progress Notes (Signed)
[facility]  PATIENT: Melissa Levy DOB: 1966-06-15  REFERRING CLINICIAN: N Redmon HISTORY FROM: patient  REASON FOR VISIT: new consult    HISTORICAL  CHIEF COMPLAINT:  Chief Complaint  Patient presents with  . Headaches    Rm 6/ New patient     HISTORY OF PRESENT ILLNESS:   49 year old right-handed female with history of migraine headaches, Graves' disease status post thyroidectomy, lumbar discectomy 2005, lumbar fusion 2011, SVT status post ablation 2015, here for evaluation of new type of headache and neck pain since February 2016. Patient referred for consultation by N Redmon PA.  Patient has history of migraine headaches since her 30s. She describes unilateral squeezing throbbing severe pain radiating to retro-orbital region, associated with photophobia, dizziness, nausea. Patient went to have 1 migraine every 6 months. She was treated with Maxalt which helps control the migraines. Sometimes she would see dark spots before the onset of migraine. Triggering factors would include MSG or stress.  In January patient had onset of significant upper respiratory infection symptoms with almost 2 months of coughing spasms. She went through 2 rounds of antibiotics and eventually upper respiratory infection symptoms improved. However she developed daily nagging muscle spasm in her neck, occipital region, with headaches, dizziness, nausea, pain and pressure sensation. She also felt pain and pressure in her left ear, left throat when swallowing. When she stands up quickly she feels a whooshing and pressure sensation in her head. She also feels a ice grip squeezing sensation in the back of her head. She has had significant neck spasm and neck stiffness.  Patient has been taking ibuprofen 800 mg twice day for the past one month. She intimately uses Vicodin and Maxalt for pain relief.  Patient had evaluation with PCP, ER, CT scan of the head, and has been given diagnosis of  migraine and tension headache. Patient is concerned that she may have an alternate diagnosis.   REVIEW OF SYSTEMS: Full 14 system review of systems performed and notable only for fatigue headache dizziness insomnia depression anxiety not asleep decreased energy change in appetite disinterest in activities. Recent diagnosis of sleep apnea and CPAP machine therapy is pending.    ALLERGIES: Allergies  Allergen Reactions  . Dilaudid [Hydromorphone Hcl] Hives and Itching    HOME MEDICATIONS: Outpatient Prescriptions Prior to Visit  Medication Sig Dispense Refill  . ALPRAZolam (XANAX) 0.25 MG tablet Take 0.25 mg by mouth daily as needed for anxiety.     . Black Cohosh 540 MG CAPS Take 540 mg by mouth daily.    Marland Kitchen ibuprofen (ADVIL,MOTRIN) 800 MG tablet Take 1 tablet (800 mg total) by mouth every 8 (eight) hours as needed for mild pain. 30 tablet 0  . magnesium oxide (MAG-OX) 400 MG tablet Take 400 mg by mouth daily.    . Multiple Vitamin (MULTIVITAMIN WITH MINERALS) TABS tablet Take 2 tablets by mouth daily.    Marland Kitchen SYNTHROID 125 MCG tablet Take 125 mcg by mouth daily before breakfast.     . venlafaxine XR (EFFEXOR-XR) 37.5 MG 24 hr capsule Take 37.5 mg by mouth 2 (two) times daily.     . vitamin C (ASCORBIC ACID) 500 MG tablet Take 500 mg by mouth daily.    . ranitidine (ZANTAC) 150 MG tablet Take 150 mg by mouth daily.     No facility-administered medications prior to visit.    PAST MEDICAL HISTORY: Past Medical History  Diagnosis Date  . Thyroid disease     Graves; now  hypothyroid  . Hyperlipidemia   . SVT (supraventricular tachycardia)   . Complication of anesthesia   . PONV (postoperative nausea and vomiting)   . Dysrhythmia     HX OF SVT  . GERD (gastroesophageal reflux disease)   . Graves disease   . Headache     PAST SURGICAL HISTORY: Past Surgical History  Procedure Laterality Date  . Back surgery    . Abdominal hysterectomy    . Thyroidectomy    . Ablation of  dysrhythmic focus  05/12/2014    DR Ladona Ridgel  . Supraventricular tachycardia ablation N/A 05/12/2014    Procedure: SUPRAVENTRICULAR TACHYCARDIA ABLATION;  Surgeon: Marinus Maw, MD;  Location: [facility] CATH LAB;  Service: Cardiovascular;  Laterality: N/A;  . Tubal ligation    . Lasik      FAMILY HISTORY: Family History  Problem Relation Age of Onset  . Heart disease Father     Atrial fibrillation; ablation  . Heart disease Paternal Aunt   . Heart disease Paternal Uncle     SOCIAL HISTORY:  History   Social History  . Marital Status: Married    Spouse Name: N/A  . Number of Children: 4  . Years of Education: N/A   Occupational History  . admin assitant    Social History Main Topics  . Smoking status: Never Smoker   . Smokeless tobacco: Never Used  . Alcohol Use: 0.0 oz/week    0 Standard drinks or equivalent per week     Comment: Occasional  . Drug Use: No  . Sexual Activity: Not on file   Other Topics Concern  . Not on file   Social History Narrative     PHYSICAL EXAM  Filed Vitals:   12/30/14 0804 12/30/14 0811  BP: 124/85 120/87  Pulse: 82 91  Temp:  98.2 F (36.8 C)  TempSrc:  Oral  Resp: 18   Height:  (1.753 m)   Weight: 228 lb (103.42 kg)     Body mass index is 33.65 kg/(m^2).   Visual Acuity Screening   Right eye Left eye Both eyes  Without correction:  With correction:       No flowsheet data found.  GENERAL EXAM: Patient is in no distress; well developed, nourished and groomed; neck is STIFF WITH DECR ROM AND INCR PAIN WITH FLEX, EXT AND ROTATION.  CARDIOVASCULAR: Regular rate and rhythm, no murmurs, no carotid bruits  NEUROLOGIC: MENTAL STATUS: awake, alert, oriented to person, place and time, recent and remote memory intact, normal attention and concentration, language fluent, comprehension intact, naming intact, fund of knowledge appropriate CRANIAL NERVE: no papilledema on fundoscopic exam, pupils equal and  reactive to light, visual fields full to confrontation, extraocular muscles intact, no nystagmus, facial sensation and strength symmetric, hearing intact, palate elevates symmetrically, uvula midline, shoulder shrug symmetric, tongue midline. MOTOR: normal bulk and tone, full strength in the BUE, BLE SENSORY: normal and symmetric to light touch, pinprick, temperature, vibration COORDINATION: finger-nose-finger, fine finger movements normal REFLEXES: deep tendon reflexes present and symmetric GAIT/STATION: narrow based gait; able to walk tandem; romberg is negative    DIAGNOSTIC DATA (LABS, IMAGING, TESTING) - I reviewed patient records, labs, notes, testing and imaging myself where available.  Lab Results  Component Value Date   WBC 9.3 05/06/2014   HGB 12.9 05/06/2014   HCT 37.4 05/06/2014   MCV 86.7 05/06/2014   PLT 287.0 05/06/2014      Component Value Date/Time   NA 134*  05/06/2014 0824   K 4.2 05/06/2014 0824   CL 101 05/06/2014 0824   CO2 27 05/06/2014 0824   GLUCOSE 110* 05/06/2014 0824   BUN 13 05/06/2014 0824   CREATININE 0.6 05/06/2014 0824   CREATININE 0.63 04/22/2014 0914   CALCIUM 8.7 05/06/2014 0824   GFRNONAA 87* 04/18/2014 0210   GFRAA >90 04/18/2014 0210   No results found for: CHOL, HDL, LDLCALC, LDLDIRECT, TRIG, CHOLHDL Lab Results  Component Value Date   HGBA1C 5.7* 04/22/2014   No results found for: VITAMINB12 No results found for: TSH   12/16/14 CT head - normal [I reviewed images myself and agree with interpretation. -VRP]     ASSESSMENT AND PLAN  49 y.o. year old female here with HA and neck pain since Jan-Feb 2016, following severe coughing spasm and upper respiratory infection. 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 Neurologic examination unremarkable except for muscle spasms in the cervical paraspinal muscles and trapezius muscles. This was likely triggered by her prolonged upper respiratory infection and coughing  spasms.   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 topiramate (TOPAMAX) 50 MG tablet    Sig: Take 1 tablet (50 mg total) by mouth 2 (two) times daily.    Dispense:  60 tablet    Refill:  3    Return in about 3 months (around 04/01/2015).    Suanne MarkerVIKRAM R. PENUMALLI, MD 12/30/2014, 9:08 AM Certified in Neurology, Neurophysiology and Neuroimaging  [facility] [address], [address] [place], [place] [address] [phone]

## 2014-12-30 NOTE — Patient Instructions (Addendum)
Try topiramate 50mg  at bedtime. Then increase to twice a day after 1 week. Drink plenty of water. Caution with kidney stones. May cause tingling. May cause sodas to taste funny.

## 2015-03-02 ENCOUNTER — Ambulatory Visit: Payer: BLUE CROSS/BLUE SHIELD | Admitting: Pulmonary Disease

## 2015-03-05 ENCOUNTER — Ambulatory Visit (INDEPENDENT_AMBULATORY_CARE_PROVIDER_SITE_OTHER): Payer: BLUE CROSS/BLUE SHIELD | Admitting: Pulmonary Disease

## 2015-03-05 ENCOUNTER — Encounter: Payer: Self-pay | Admitting: Pulmonary Disease

## 2015-03-05 VITALS — BP 120/88 | HR 84 | Temp 97.1°F | Ht 68.0 in | Wt 231.0 lb

## 2015-03-05 DIAGNOSIS — G4733 Obstructive sleep apnea (adult) (pediatric): Secondary | ICD-10-CM | POA: Diagnosis not present

## 2015-03-05 NOTE — Assessment & Plan Note (Signed)
The patient has done very well since starting on C Pap, and has seen a significant improvement in her sleep and daytime alertness. She is having no issues with her pressure or mask fit, and her download today shows excellent control of her sleep apnea.  I have asked her to try and increase her sleep time on the device, and also to work aggressively on weight reduction. She will follow-up in 6 months to check on her progress.

## 2015-03-05 NOTE — Patient Instructions (Signed)
Continue on cpap, and try to increased hours of use Work on weight loss followup with Dr. Craige CottaSood in 6mos.

## 2015-03-05 NOTE — Progress Notes (Signed)
   Subjective:    Patient ID: Melissa Levy, female    DOB: 11/19/1965, 49 y.o.   MRN: [id number]  HPI Patient comes in today for follow-up of her obstructive sleep apnea. She was started on C Pap at the last visit, and comes in today for follow-up. She is wearing her device compliantly by her download, with excellent control of her AHI. She is much better rested upon arising, and has seen increased energy and alertness during the day.   Review of Systems  Constitutional: Negative for fever and unexpected weight change.  HENT: Positive for sinus pressure and sneezing. Negative for congestion, dental problem, ear pain, nosebleeds, postnasal drip, rhinorrhea, sore throat and trouble swallowing.   Eyes: Negative for redness and itching.  Respiratory: Negative for cough, chest tightness, shortness of breath and wheezing.   Cardiovascular: Negative for palpitations and leg swelling.  Gastrointestinal: Negative for nausea and vomiting.  Genitourinary: Negative for dysuria.  Musculoskeletal: Negative for joint swelling.  Skin: Negative for rash.  Neurological: Negative for headaches.  Hematological: Bruises/bleeds easily.  Psychiatric/Behavioral: Negative for dysphoric mood. The patient is not nervous/anxious.        Objective:   Physical Exam Overweight female in no acute distress Nose without purulence or discharge noted No skin breakdown or pressure necrosis from the C Pap mask Neck without lymphadenopathy or thyromegaly Lower extremities without edema, no cyanosis Alert, does not appear to be sleepy, moves all 4 extremities.       Assessment & Plan:

## 2015-03-09 ENCOUNTER — Ambulatory Visit: Payer: BLUE CROSS/BLUE SHIELD | Admitting: Diagnostic Neuroimaging

## 2015-03-24 ENCOUNTER — Ambulatory Visit: Payer: BLUE CROSS/BLUE SHIELD | Admitting: Diagnostic Neuroimaging

## 2015-10-11 ENCOUNTER — Emergency Department (HOSPITAL_COMMUNITY): Payer: BLUE CROSS/BLUE SHIELD

## 2015-10-11 ENCOUNTER — Telehealth: Payer: Self-pay | Admitting: Internal Medicine

## 2015-10-11 ENCOUNTER — Emergency Department (HOSPITAL_COMMUNITY)
Admission: EM | Admit: 2015-10-11 | Discharge: 2015-10-11 | Disposition: A | Discharge status: Home / Self Care | Payer: BLUE CROSS/BLUE SHIELD | Attending: Emergency Medicine | Admitting: Emergency Medicine

## 2015-10-11 ENCOUNTER — Encounter (HOSPITAL_COMMUNITY): Payer: Self-pay

## 2015-10-11 DIAGNOSIS — R61 Generalized hyperhidrosis: Secondary | ICD-10-CM | POA: Diagnosis not present

## 2015-10-11 DIAGNOSIS — R51 Headache: Secondary | ICD-10-CM | POA: Insufficient documentation

## 2015-10-11 DIAGNOSIS — E039 Hypothyroidism, unspecified: Secondary | ICD-10-CM | POA: Insufficient documentation

## 2015-10-11 DIAGNOSIS — R079 Chest pain, unspecified: Secondary | ICD-10-CM | POA: Insufficient documentation

## 2015-10-11 DIAGNOSIS — Z79899 Other long term (current) drug therapy: Secondary | ICD-10-CM | POA: Insufficient documentation

## 2015-10-11 DIAGNOSIS — R42 Dizziness and giddiness: Secondary | ICD-10-CM | POA: Diagnosis not present

## 2015-10-11 DIAGNOSIS — R0602 Shortness of breath: Secondary | ICD-10-CM | POA: Diagnosis not present

## 2015-10-11 DIAGNOSIS — M79604 Pain in right leg: Secondary | ICD-10-CM | POA: Insufficient documentation

## 2015-10-11 DIAGNOSIS — K219 Gastro-esophageal reflux disease without esophagitis: Secondary | ICD-10-CM | POA: Diagnosis not present

## 2015-10-11 LAB — COMPREHENSIVE METABOLIC PANEL
ALT: 39 U/L (ref 14–54)
AST: 23 U/L (ref 15–41)
Albumin: 4.1 g/dL (ref 3.5–5.0)
Alkaline Phosphatase: 78 U/L (ref 38–126)
Anion gap: 9 (ref 5–15)
BILIRUBIN TOTAL: 0.5 mg/dL (ref 0.3–1.2)
BUN: 15 mg/dL (ref 6–20)
CO2: 23 mmol/L (ref 22–32)
Calcium: 9.2 mg/dL (ref 8.9–10.3)
Chloride: 104 mmol/L (ref 101–111)
Creatinine, Ser: 0.78 mg/dL (ref 0.44–1.00)
GFR calc Af Amer: >60 mL/min (ref >60)
GFR calc non Af Amer: >60 mL/min (ref >60)
Glucose, Bld: 140 mg/dL — ABNORMAL HIGH (ref 65–99)
POTASSIUM: 3.6 mmol/L (ref 3.5–5.1)
SODIUM: 136 mmol/L (ref 135–145)
TOTAL PROTEIN: 7.5 g/dL (ref 6.5–8.1)

## 2015-10-11 LAB — I-STAT TROPONIN, ED: Troponin i, poc: 0 ng/mL (ref 0.00–0.08)

## 2015-10-11 LAB — BRAIN NATRIURETIC PEPTIDE: B Natriuretic Peptide: 6 pg/mL (ref 0.0–100.0)

## 2015-10-11 LAB — CBC WITH DIFFERENTIAL/PLATELET
BASOS PCT: 0 %
Basophils Absolute: 0 10*3/uL (ref 0.0–0.1)
EOS ABS: 0.2 10*3/uL (ref 0.0–0.7)
EOS PCT: 2 %
HCT: 40.7 % (ref 36.0–46.0)
Hemoglobin: 14.1 g/dL (ref 12.0–15.0)
Lymphocytes Relative: 23 %
Lymphs Abs: 2.5 10*3/uL (ref 0.7–4.0)
MCH: 29.3 pg (ref 26.0–34.0)
MCHC: 34.6 g/dL (ref 30.0–36.0)
MCV: 84.6 fL (ref 78.0–100.0)
MONO ABS: 0.6 10*3/uL (ref 0.1–1.0)
MONOS PCT: 6 %
Neutro Abs: 7.6 10*3/uL (ref 1.7–7.7)
Neutrophils Relative %: 69 %
PLATELETS: 301 10*3/uL (ref 150–400)
RBC: 4.81 MIL/uL (ref 3.87–5.11)
RDW: 12 % (ref 11.5–15.5)
WBC: 11 10*3/uL — ABNORMAL HIGH (ref 4.0–10.5)

## 2015-10-11 LAB — TROPONIN I

## 2015-10-11 LAB — PROTIME-INR
INR: 1.05 (ref 0.00–1.49)
PROTHROMBIN TIME: 13.9 s (ref 11.6–15.2)

## 2015-10-11 LAB — D-DIMER, QUANTITATIVE (NOT AT ARMC): D DIMER QUANT: 0.66 ug{FEU}/mL — AB (ref 0.00–0.50)

## 2015-10-11 MED ORDER — IOHEXOL 350 MG/ML SOLN
100.0000 mL | Freq: Once | INTRAVENOUS | Status: AC | PRN
  Administered 2015-10-11: 100 mL via INTRAVENOUS

## 2015-10-11 MED ORDER — SODIUM CHLORIDE 0.9 % IV SOLN
Freq: Once | INTRAVENOUS | Status: AC
  Administered 2015-10-11: 13:00:00 via INTRAVENOUS

## 2015-10-11 MED ORDER — ONDANSETRON HCL 4 MG/2ML IJ SOLN
4.0000 mg | Freq: Once | INTRAMUSCULAR | Status: AC
  Administered 2015-10-11: 4 mg via INTRAVENOUS
  Filled 2015-10-11: qty 2

## 2015-10-11 MED ORDER — ASPIRIN 81 MG PO CHEW
324.0000 mg | CHEWABLE_TABLET | Freq: Once | ORAL | Status: AC
  Administered 2015-10-11: 324 mg via ORAL
  Filled 2015-10-11: qty 4

## 2015-10-11 MED ORDER — MORPHINE SULFATE (PF) 4 MG/ML IV SOLN
4.0000 mg | INTRAVENOUS | Status: DC | PRN
  Administered 2015-10-11: 4 mg via INTRAVENOUS
  Filled 2015-10-11: qty 1

## 2015-10-11 NOTE — ED Notes (Addendum)
I-stat troponin due at 1600.

## 2015-10-11 NOTE — Telephone Encounter (Signed)
New Message   Pt c/o of Chest Pain: STAT if CP now or developed within 24 hours  1. Are you having CP right now? Little after breathing deep- dull ache-   2. Are you experiencing any other symptoms (ex. SOB, nausea, vomiting, sweating)? SOB, lightheadedness,   3. How long have you been experiencing CP? 2 days  4. Is your CP continuous or coming and going? Constant pressure all the time- more pain during deep breaths,   5. Have you taken Nitroglycerin? No, just aspirin  ?

## 2015-10-11 NOTE — Progress Notes (Signed)
Cardiology Office Note Date:  10/12/2015  Patient ID:  Melissa Levy, DOB 04-09-1966, MRN [id number] PCP:  REDMON,NOELLE, PA-C  Cardiologist:  Dr. Jens Somrenshaw (saw once) Electrophysiologist: Dr. Ladona Ridgelaylor Pulmonologist: Dr. Shelle Ironlance, pending new MD Neurologist: Dr. Marjory LiesPenumalli   Chief Complaint:  Micah FlesherWent to ER 10/10/14 with c/o CP and here to f/u for further evaluation. History of Present Illness: Melissa MinisterHolly P Levy is a 50 y.o. female with history of SVT s/p ablation on 05/12/14 by Dr. Ulyses Southwardaylor, Graves now hypothyroid, and OSA diagnosed this year on CPAP compliant at her last pul/OSA office visit, headaches following with neurology.    She was the ER yesterday with c/o CP felt to be pleuritic by ER MD, Trop I and POC negative q 3hrs, elevated Ddimer with a negative CT chest for PE, finding small nodular opacities and other findings she needs to f/u with her pulmonologist for. RLE venous US was negative for DVT  She describes her CP as an aching sensation since about Saturday, central/heavy changes with deep breath in, it is ongoing since it started, states it is always there, waxes/wanes but never gone since it started.  She feels like she is getting winded with routine activities, and reports a couple lightheaded episodes, once upon standing and yesterday while standing in the shower, both passed without syncope.  She feels tired, no energy, very unusual for her and new since the weekend.  She denies any recent illness or fever.  She has not had palpitations or recurrent SVT since her ablation.  No dizziness, near syncope or syncope.   Past Medical History  Diagnosis Date  . Thyroid disease     Graves; now hypothyroid  . Hyperlipidemia   . SVT (supraventricular tachycardia) (HCC)   . Complication of anesthesia   . PONV (postoperative nausea and vomiting)   . Dysrhythmia     HX OF SVT  . GERD (gastroesophageal reflux disease)   . Graves disease   . Headache     Past Surgical History  Procedure Laterality  Date  . Back surgery    . Abdominal hysterectomy    . Thyroidectomy    . Ablation of dysrhythmic focus  05/12/2014    DR Ladona RidgelAYLOR  . Supraventricular tachycardia ablation N/A 05/12/2014    Procedure: SUPRAVENTRICULAR TACHYCARDIA ABLATION;  Surgeon: Marinus MawGregg W Taylor, MD;  [facility];  Service: Cardiovascular;  Laterality: N/A;  . Tubal ligation    . Lasik      Current Outpatient Prescriptions  Medication Sig Dispense Refill  . ALPRAZolam (XANAX) 0.25 MG tablet Take 0.25 mg by mouth daily as needed for anxiety.     Marland Kitchen. ibuprofen (ADVIL,MOTRIN) 800 MG tablet Take 1 tablet (800 mg total) by mouth every 8 (eight) hours as needed for mild pain. 30 tablet 0  . magnesium oxide (MAG-OX) 400 MG tablet Take 400 mg by mouth at bedtime.     Marland Kitchen. omeprazole (PRILOSEC) 20 MG capsule Take 20 mg by mouth daily.    Marland Kitchen. SYNTHROID 125 MCG tablet Take 125 mcg by mouth daily before breakfast.     . venlafaxine XR (EFFEXOR-XR) 37.5 MG 24 hr capsule Take 75 mg by mouth daily with breakfast.     . ZETIA 10 MG tablet Take 1 tablet by mouth at bedtime.   3   No current facility-administered medications for this visit.    Allergies:   Dilaudid   Social History:  The patient  reports that she has never smoked. She has never  used smokeless tobacco. She reports that she drinks alcohol. She reports that she does not use illicit drugs.   Family History:  The patient's family history includes Heart disease in her father, paternal aunt, and paternal uncle.  ROS:  Please see the history of present illness.  All other systems are reviewed and otherwise negative.   PHYSICAL EXAM:  VS:  BP 126/80 mmHg  Pulse 90  Ht 5\' 9"  (1.753 m)  Wt 229 lb (103.874 kg)  BMI 33.80 kg/m2 BMI: Body mass index is 33.8 kg/(m^2). Well nourished, well developed, in no acute distress HEENT: normocephalic, atraumatic Neck: no JVD, carotid bruits or masses Cardiac:  normal S1, S2; RRR; no significant murmurs, no rubs, or gallops Lungs:   clear to auscultation bilaterally, no wheezing, rhonchi or rales Abd: soft, nontender MS: no deformity or atrophy Ext: no edema Skin: warm and dry, no rash Neuro:  No gross deficits appreciated Psych: euthymic mood, full affect   EKG:  Done today shows SR, no acute/ischemic changes  05/12/14: EPS/ablation, Dr. Ladona Ridgel Conclusion.  Successful electrophysiology study and catheter ablation of AV node reentrant tachycardia with 2 radiofrequency energy applications delivered to the slow pathway, resulting in accelerated junctional rhythm, and rendering the SVT noninducible, despite minimal residual slow pathway conduction  05/06/14: ETT Comments: The patient had a good exercise tolerance. There was no chest  pain. There was an appropriate level of dyspnea. There were no  arrhythmias, a normal heart rate response and normal BP response. There were no ischemic ST T wave changes and a normal heart rate recovery. The Duke Score was 4 with a moderate risk category.   04/23/14: Echocardiogram Study Conclusions - Left ventricle: The cavity size was normal. Systolic function was normal. The estimated ejection fraction was in the range of 55% to 60%. Wall motion was normal; there were no regional wall motion abnormalities. There was an increased relative contribution of atrial contraction to ventricular filling. Doppler parameters are consistent with abnormal left ventricular relaxation (grade 1 diastolic dysfunction).  Recent Labs: 10/11/2015: ALT 39; B Natriuretic Peptide 6.0; BUN 15; Creatinine, Ser 0.78; Hemoglobin 14.1; Platelets 301; Potassium 3.6; Sodium 136  No results found for requested labs within last 365 days.   Estimated Creatinine Clearance: 111.6 mL/min (by C-G formula based on Cr of 0.78).   Wt Readings from Last 3 Encounters:  10/12/15 229 lb (103.874 kg)  10/11/15 230 lb (104.327 kg)  03/05/15 231 lb (104.781 kg)     Other studies reviewed: Additional  studies/records reviewed today include: summarized above  ASSESSMENT AND PLAN:  1. SVT     Ablation August 2015 with Dr. Ladona Ridgel  2. CP/fatigue, lightheaded Orthostatic vitals were done and negative  D/w Dr. Ladona Ridgel, she is being scheduled for stress myoview for ASAP and f/u with interventional/cardiology in [place] (where she lives) afterwards.  The patient is taking ASA 81mg  daily at the recommendation of the ER yesterday and will contniue for now.   Disposition: F/u with stress/cardiology as noted above, the patient is further instructed should she have any escalation/changing of her symptoms to seek medical attention immediately, she is emotional, concerned about her symptoms and suggest going to the hospital from here, though she at this time would like to see how quickly her testing can be scheduled first and go from there.  Current medicines are reviewed at length with the patient today.  The patient did not have any concerns regarding medicines.  Judith Blonder, PA-C 10/12/2015 12:42 PM  [place] [address] [phone] (office)  [phone] (fax)

## 2015-10-11 NOTE — ED Notes (Signed)
VO from Dr. Jodi MourningZavitz for D-dimer.

## 2015-10-11 NOTE — Discharge Instructions (Signed)
Take aspirin 81 mg daily until cleared by her primary doctor and cardiologist. Limits exertional activities until you see her physician.  If you were given medicines take as directed.  If you are on coumadin or contraceptives realize their levels and effectiveness is altered by many different medicines.  If you have any reaction (rash, tongues swelling, other) to the medicines stop taking and see a physician.    If your blood pressure was elevated in the ER make sure you follow up for management with a primary doctor or return for chest pain, shortness of breath or stroke symptoms.  Please follow up as directed and return to the ER or see a physician for new or worsening symptoms.  Thank you. Filed Vitals:   10/11/15 1255 10/11/15 1300 10/11/15 1330 10/11/15 1400  BP: 154/99 155/95 130/95 122/85  Pulse: 88 89 85 80  Resp: 20 16  19   Height: 5\' 9"  (1.753 m)     Weight: 230 lb (104.327 kg)     SpO2: 96% 98% 97% 96%

## 2015-10-11 NOTE — ED Notes (Signed)
Patient reports of chest pain that started Saturday while sitting. States pain is worse when she takes deep breath in. Reports of shortness of breath at times. Took 1 baby asa Saturday and had relief in chest per patient. NAD noted.

## 2015-10-11 NOTE — Telephone Encounter (Signed)
Pt states starting Saturday she devleoped a dull ache and pressure in her chest.  Pt states this is associated with shortness of breath with minimal exertion, and lightheadedness.  Pt states that the dull ache and chest pressure have been steady since Saturday, pt states that she has a dull ache and pressure in her chest at this time.  Pt states nothing makes it better.  Pt does not know her heart rate or BP, she does feel her heart rate may have been faster than usual recently.   [facility], pt advised not to drive and [facility] now.

## 2015-10-11 NOTE — ED Provider Notes (Signed)
CSN: [id number]     Arrival date & time 10/11/15  1245 History  By signing my name below, I, Emmanuella Mensah, attest that this documentation has been prepared under the direction and in the presence of Blane Ohara, MD.  Electronically Signed: Angelene Giovanni, ED Scribe. 10/11/2015. 1:07 PM.    Chief Complaint  Patient presents with  . Chest Pain   The history is provided by the patient. No language interpreter was used.   HPI Comments: Melissa Levy is a 50 y.o. female with a hx of thyroid disease, HLD, SVT, and GERD who presents to the Emergency Department complaining of gradually worsening constant dull chest pressure onset 2 days ago. She reports associated SOB on exertion, moderate HA, diaphoresis, and lightheadedness. She adds that her urine has been darker and she has left-sided neck pain. She explains that she had right leg pain one day before onset. She reports that the chest pressure becomes sharp when she takes a deep breath. She states that she took an aspirin which alleviated her leg cramp and chest pressure. She denies any hx of cancers or any recent surgeries. She reports a paternal hx of heart issues. She denies currently being on any hormones. No fever.    Past Medical History  Diagnosis Date  . Thyroid disease     Graves; now hypothyroid  . Hyperlipidemia   . SVT (supraventricular tachycardia) (HCC)   . Complication of anesthesia   . PONV (postoperative nausea and vomiting)   . Dysrhythmia     HX OF SVT  . GERD (gastroesophageal reflux disease)   . Graves disease   . Headache    Past Surgical History  Procedure Laterality Date  . Back surgery    . Abdominal hysterectomy    . Thyroidectomy    . Ablation of dysrhythmic focus  05/12/2014    DR Ladona Ridgel  . Supraventricular tachycardia ablation N/A 05/12/2014    Procedure: SUPRAVENTRICULAR TACHYCARDIA ABLATION;  Surgeon: Marinus Maw, MD;  Location[facility] C[facility] LAB;  Service: Cardiovascular;  Laterality: N/A;  . Tubal  ligation    . Lasik     Family History  Problem Relation Age of Onset  . Heart disease Father     Atrial fibrillation; ablation  . Heart disease Paternal Aunt   . Heart disease Paternal Uncle    Social History  Substance Use Topics  . Smoking status: Never Smoker   . Smokeless tobacco: Never Used  . Alcohol Use: 0.0 oz/week    0 Standard drinks or equivalent per week     Comment: Occasional   OB History    No data available     Review of Systems  Constitutional: Positive for diaphoresis. Negative for fever.  Respiratory: Positive for shortness of breath.   Musculoskeletal: Positive for arthralgias (right leg pain prior to onset).  Neurological: Positive for light-headedness and headaches.  All other systems reviewed and are negative.     Allergies  Dilaudid  Home Medications   Prior to Admission medications   Medication Sig Start Date End Date Taking? Authorizing Provider  magnesium oxide (MAG-OX) 400 MG tablet Take 400 mg by mouth at bedtime.    Yes Historical Provider, MD  omeprazole (PRILOSEC) 20 MG capsule Take 20 mg by mouth daily.   Yes Historical Provider, MD  SYNTHROID 125 MCG tablet Take 125 mcg by mouth daily before breakfast.  04/16/14  Yes Historical Provider, MD  venlafaxine XR (EFFEXOR-XR) 37.5 MG 24 hr capsule Take 75 mg  by mouth daily with breakfast.  02/15/14  Yes Historical Provider, MD  ZETIA 10 MG tablet Take 1 tablet by mouth at bedtime.  02/24/15  Yes Historical Provider, MD  ALPRAZolam Prudy Feeler(XANAX) 0.25 MG tablet Take 0.25 mg by mouth daily as needed for anxiety.  02/09/14   Historical Provider, MD  ibuprofen (ADVIL,MOTRIN) 800 MG tablet Take 1 tablet (800 mg total) by mouth every 8 (eight) hours as needed for mild pain. 12/09/14   Kristen N Ward, DO  topiramate (TOPAMAX) 50 MG tablet Take 1 tablet (50 mg total) by mouth 2 (two) times daily. Patient not taking: Reported on 03/05/2015 12/30/14   Suanne MarkerVikram R Penumalli, MD   BP 129/77 mmHg  Pulse 85  Resp 20  Ht  5\' 9"  (1.753 m)  Wt 230 lb (104.327 kg)  BMI 33.95 kg/m2  SpO2 97% Physical Exam  Constitutional: She is oriented to person, place, and time. She appears well-developed and well-nourished. No distress.  HENT:  Head: Normocephalic and atraumatic.  Eyes: Conjunctivae and EOM are normal.  Neck: Neck supple. No tracheal deviation present.  Cardiovascular: Normal rate and regular rhythm.   Pulmonary/Chest: Effort normal. No respiratory distress.  Lungs clear anteriorly and in the back  Abdominal: Soft. There is no tenderness.  Musculoskeletal: Normal range of motion.  Very minimal leg swelling bilaterally  Neurological: She is alert and oriented to person, place, and time.  Skin: Skin is warm and dry.  Psychiatric: She has a normal mood and affect. Her behavior is normal.  Nursing note and vitals reviewed.   ED Course  Procedures (including critical care time) DIAGNOSTIC STUDIES: Oxygen Saturation is 96% on RA, normal by my interpretation.    COORDINATION OF CARE: 1:06 PM- Pt advised of plan for treatment and pt agrees. Pt will receive an EKG, chest x-ray, D-dimer, CBC, Troponin, and comprehensive metabolic panel for further evaluation.    Labs Review Labs Reviewed  CBC WITH DIFFERENTIAL/PLATELET - Abnormal; Notable for the following:    WBC 11.0 (*)    All other components within normal limits  COMPREHENSIVE METABOLIC PANEL - Abnormal; Notable for the following:    Glucose, Bld 140 (*)    All other components within normal limits  D-DIMER, QUANTITATIVE (NOT AT [facility]) - Abnormal; Notable for the following:    D-Dimer, Quant 0.66 (*)    All other components within normal limits  TROPONIN I  BRAIN NATRIURETIC PEPTIDE  PROTIME-INR    Imaging Review Ct Angio Chest Pe W/cm &/or Wo Cm  10/11/2015  CLINICAL DATA:  Shortness of breath for 2 days EXAM: CT ANGIOGRAPHY CHEST WITH CONTRAST TECHNIQUE: Multidetector CT imaging of the chest was performed using the standard protocol during  bolus administration of intravenous contrast. Multiplanar CT image reconstructions and MIPs were obtained to evaluate the vascular anatomy. CONTRAST:  100mL OMNIPAQUE IOHEXOL 350 MG/ML SOLN COMPARISON:  Chest radiograph April 23, 2014 FINDINGS: There is no demonstrable pulmonary embolus. Reflux of contrast into the azygos vein is not felt to have clinical significance. There is no thoracic aortic aneurysm or dissection. The visualized great vessels appear normal. There is no parenchymal lung edema or consolidation. There is slight lung base atelectatic change bilaterally. On axial slice 28 series 8, there is a 7 x 6 mm nodular opacity abutting the major fissure in the posterior segment left upper lobe. This nodular opacity may represent a focal lymph node given other findings. A similar probable lymph node is noted immediately adjacent to a posterior segment right  upper lobe bronchus, best seen on axial slice 32 series 7 and coronal slice 46 series 9 measuring 5 x 5 mm. There are multiple areas of adenopathy in the thoracic region. The largest individual lymph node measures 2.1 x 1.7 cm. There are bilateral prominent lymph nodes in each hilar region with the largest lymph node in the left hilum measuring 2.0 x 1.2 cm, and the largest lymph node in the right hilum measuring 1.5 x 1.0 cm. There are several borderline prominent right peritracheal lymph nodes as well. He thyroid is absent consistent with previous thyroidectomy. Pericardium is not thickened. In the visualized upper abdomen, there is hepatic steatosis. There is degenerative change in the thoracic spine. There are no blastic or lytic bone lesions. Review of the MIP images confirms the above findings. IMPRESSION: No demonstrable pulmonary embolus. No lung edema or consolidation. Small nodular opacities likely represent small lymph nodes. Followup of these nodular opacity should be based on Fleischner Society guidelines. If the patient is at high risk for  bronchogenic carcinoma, follow-up chest CT at 3-59months is recommended. If the patient is at low risk for bronchogenic carcinoma, follow-up chest CT at 6-12 months is recommended. This recommendation follows the consensus statement: Guidelines for Management of Small Pulmonary Nodules Detected on CT Scans: A Statement from the Fleischner Society as published in Radiology 2005; 237:395-400. Areas of adenopathy. Question sarcoidosis, although neoplastic etiology cannot be excluded. Patient is status post thyroidectomy. Hepatic steatosis. Electronically Signed   By: Bretta Bang III M.D.   On: 10/11/2015 14:57   US Venous Img Lower Unilateral Right  10/11/2015  CLINICAL DATA:  Shortness of breath with pleuritic chest pain. RIGHT lower extremity pain for 3-4 days. EXAM: RIGHT LOWER EXTREMITY VENOUS DOPPLER ULTRASOUND TECHNIQUE: Gray-scale sonography with graded compression, as well as color Doppler and duplex ultrasound were performed to evaluate the lower extremity deep venous systems from the level of the common femoral vein and including the common femoral, femoral, profunda femoral, popliteal and calf veins including the posterior tibial, peroneal and gastrocnemius veins when visible. The superficial great saphenous vein was also interrogated. Spectral Doppler was utilized to evaluate flow at rest and with distal augmentation maneuvers in the common femoral, femoral and popliteal veins. COMPARISON:  CT chest is reported separately. FINDINGS: Contralateral Common Femoral Vein: Respiratory phasicity is normal and symmetric with the symptomatic side. No evidence of thrombus. Normal compressibility. Common Femoral Vein: No evidence of thrombus. Normal compressibility, respiratory phasicity and response to augmentation. Saphenofemoral Junction: No evidence of thrombus. Normal compressibility and flow on color Doppler imaging. Profunda Femoral Vein: No evidence of thrombus. Normal compressibility and flow on color  Doppler imaging. Femoral Vein: No evidence of thrombus. Normal compressibility, respiratory phasicity and response to augmentation. Popliteal Vein: No evidence of thrombus. Normal compressibility, respiratory phasicity and response to augmentation. Calf Veins: No evidence of thrombus. Normal compressibility and flow on color Doppler imaging. Superficial Great Saphenous Vein: No evidence of thrombus. Normal compressibility and flow on color Doppler imaging. Venous Reflux:  None. Other Findings:  None. IMPRESSION: No evidence of RIGHT lower extremity deep venous thrombosis. Electronically Signed   By: Elsie Stain M.D.   On: 10/11/2015 13:52     Blane Ohara, MD has personally reviewed and evaluated these images and lab results as part of his medical decision-making.   EKG Interpretation   Date/Time:  Monday October 11 2015 12:52:59 EST Ventricular Rate:  91 PR Interval:  151 QRS Duration: 90 QT Interval:  359 QTC  Calculation: 442 R Axis:   59 Text Interpretation:  Sinus rhythm Baseline wander in lead(s) II III aVL  aVF V6 Confirmed by Fatmata Legere  MD, Mikhaela Zaugg ([id number]) on 10/11/2015 1:20:50 PM      MDM   Final diagnoses:  Chest pain, unspecified chest pain type   I personally performed the services described in this documentation, which was scribed in my presence. The recorded information has been reviewed and is accurate.  Patient presents with pleuritic chest pain worsening since Saturday. Patient low risk pulmonary wasn't and low risk blood clot. D-dimer positive. CT angina the chest and ultrasound leg ordered. Ultrasound no blood clot. CT pending. Plan for delta troponin and patient need close follow-up outpatient for further workup and possible stress test. Patient chest pain-free currently. Aspirin ordered.  CT scan no blood clot, patient will need outpatient follow up and repeat CT scan for pulmonary nodule in 3-6 months. Plan for delta troponin and if negative patient be discharged for  outpatient follow-up.  Results and differential diagnosis were discussed with the patient/parent/guardian. Close follow up outpatient was discussed, comfortable with the plan.   Medications  morphine 4 MG/ML injection 4 mg (4 mg Intravenous Given 10/11/15 1501)  0.9 %  sodium chloride infusion ( Intravenous New Bag/Given 10/11/15 1322)  ondansetron (ZOFRAN) injection 4 mg (4 mg Intravenous Given 10/11/15 1323)  iohexol (OMNIPAQUE) 350 MG/ML injection 100 mL (100 mLs Intravenous Contrast Given 10/11/15 1415)  aspirin chewable tablet 324 mg (324 mg Oral Given 10/11/15 1500)    Filed Vitals:   10/11/15 1330 10/11/15 1400 10/11/15 1430 10/11/15 1500  BP: 130/95 122/85 124/77 129/77  Pulse: 85 80 81 85  Resp:  19 14 20   Height:      Weight:      SpO2: 97% 96% 100% 97%    Final diagnoses:  Chest pain, unspecified chest pain type      Blane Ohara, MD 10/11/15 1512

## 2015-10-12 ENCOUNTER — Encounter: Payer: Self-pay | Admitting: Physician Assistant

## 2015-10-12 ENCOUNTER — Ambulatory Visit (INDEPENDENT_AMBULATORY_CARE_PROVIDER_SITE_OTHER): Payer: BLUE CROSS/BLUE SHIELD | Admitting: Physician Assistant

## 2015-10-12 ENCOUNTER — Other Ambulatory Visit: Payer: Self-pay | Admitting: *Deleted

## 2015-10-12 VITALS — BP 126/80 | HR 90 | Ht 69.0 in | Wt 229.0 lb

## 2015-10-12 DIAGNOSIS — R079 Chest pain, unspecified: Secondary | ICD-10-CM | POA: Diagnosis not present

## 2015-10-12 DIAGNOSIS — R55 Syncope and collapse: Secondary | ICD-10-CM

## 2015-10-12 DIAGNOSIS — R42 Dizziness and giddiness: Secondary | ICD-10-CM

## 2015-10-12 NOTE — Addendum Note (Signed)
Addended by: Oleta MouseVERTON, Justo Hengel M on: 10/12/2015 03:48 PM   Modules accepted: Orders

## 2015-10-12 NOTE — Patient Instructions (Addendum)
Medication Instructions:   Your physician recommends that you continue on your current medications as directed. Please refer to the Current Medication list given to you today.    If you need a refill on your cardiac medications before your next appointment, please call your pharmacy.  Labwork: NONE ORDER TODAY    Testing/Procedures: Your physician has requested that you have en exercise stress myoview. [place] For further information please visit [email or web address]. Please follow instruction sheet, as given.   Follow-Up: with dr branch or Darl Householderkoneswaren or mcdowell as new pt in [place]  Location which every one has any opening   Any Other Special Instructions Will Be Listed Below (If Applicable).  Go back to emergency room if you have any reoccuring symptoms

## 2015-10-13 ENCOUNTER — Encounter (HOSPITAL_COMMUNITY)
Admission: RE | Admit: 2015-10-13 | Discharge: 2015-10-13 | Disposition: A | Discharge status: Home / Self Care | Payer: BLUE CROSS/BLUE SHIELD | Source: Ambulatory Visit | Attending: Physician Assistant | Admitting: Physician Assistant

## 2015-10-13 ENCOUNTER — Inpatient Hospital Stay (HOSPITAL_COMMUNITY): Admission: RE | Admit: 2015-10-13 | Payer: BLUE CROSS/BLUE SHIELD | Source: Ambulatory Visit

## 2015-10-13 ENCOUNTER — Encounter (HOSPITAL_COMMUNITY): Payer: Self-pay

## 2015-10-13 DIAGNOSIS — R42 Dizziness and giddiness: Secondary | ICD-10-CM | POA: Diagnosis not present

## 2015-10-13 DIAGNOSIS — R079 Chest pain, unspecified: Secondary | ICD-10-CM

## 2015-10-13 LAB — NM MYOCAR MULTI W/SPECT W/WALL MOTION / EF
CHL CUP NUCLEAR SDS: 7
LHR: 0.26
LVDIAVOL: 16 mL
LVSYSVOL: 66 mL
NUC STRESS TID: 0.95
Peak HR: 113 {beats}/min
Rest HR: 77 {beats}/min
SRS: 0
SSS: 7

## 2015-10-13 MED ORDER — SODIUM CHLORIDE 0.9 % IJ SOLN
INTRAMUSCULAR | Status: AC
  Administered 2015-10-13: 10 mL via INTRAVENOUS
  Filled 2015-10-13: qty 3

## 2015-10-13 MED ORDER — REGADENOSON 0.4 MG/5ML IV SOLN
INTRAVENOUS | Status: AC
  Administered 2015-10-13: 0.4 mg via INTRAVENOUS
  Filled 2015-10-13: qty 5

## 2015-10-13 MED ORDER — TECHNETIUM TC 99M SESTAMIBI GENERIC - CARDIOLITE
10.0000 | Freq: Once | INTRAVENOUS | Status: AC | PRN
  Administered 2015-10-13: 10 via INTRAVENOUS

## 2015-10-13 MED ORDER — TECHNETIUM TC 99M SESTAMIBI - CARDIOLITE
30.0000 | Freq: Once | INTRAVENOUS | Status: AC | PRN
  Administered 2015-10-13: 30 via INTRAVENOUS

## 2015-10-15 ENCOUNTER — Telehealth: Payer: Self-pay | Admitting: *Deleted

## 2015-10-15 ENCOUNTER — Ambulatory Visit (INDEPENDENT_AMBULATORY_CARE_PROVIDER_SITE_OTHER): Payer: BLUE CROSS/BLUE SHIELD | Admitting: Cardiology

## 2015-10-15 ENCOUNTER — Ambulatory Visit (HOSPITAL_COMMUNITY)
Admission: RE | Admit: 2015-10-15 | Discharge: 2015-10-15 | Disposition: A | Discharge status: Home / Self Care | Payer: BLUE CROSS/BLUE SHIELD | Source: Ambulatory Visit | Attending: Cardiology | Admitting: Cardiology

## 2015-10-15 ENCOUNTER — Encounter: Payer: Self-pay | Admitting: Cardiology

## 2015-10-15 VITALS — BP 108/76 | HR 99 | Ht 69.0 in | Wt 228.0 lb

## 2015-10-15 DIAGNOSIS — R0602 Shortness of breath: Secondary | ICD-10-CM

## 2015-10-15 DIAGNOSIS — R002 Palpitations: Secondary | ICD-10-CM | POA: Diagnosis not present

## 2015-10-15 MED ORDER — ALBUTEROL SULFATE HFA 108 (90 BASE) MCG/ACT IN AERS: 2.0000 | INHALATION_SPRAY | Freq: Four times a day (QID) | RESPIRATORY_TRACT | Status: DC | PRN

## 2015-10-15 MED ORDER — ALBUTEROL SULFATE (2.5 MG/3ML) 0.083% IN NEBU
2.5000 mg | INHALATION_SOLUTION | Freq: Four times a day (QID) | RESPIRATORY_TRACT | Status: DC | PRN
Start: 2015-10-15 — End: 2015-10-15

## 2015-10-15 NOTE — Telephone Encounter (Signed)
-----   Message from [facility] Center[facility]enee Lynn Ursuy, [place]-C sent at 10/14/2015  3:11 PM EST ----- Please let her know her stress test looked good, to keep her f/u appointment as scheduled with Dr. Wyline MoodBranch tomorrow.    Thanks [employer]e

## 2015-10-15 NOTE — Patient Instructions (Addendum)
Your physician recommends that you schedule a follow-up appointment in: 50month   Take albuterol inhaler as directed    Your physician has recommended that you have a pulmonary function test. Pulmonary Function Tests are a group of tests that measure how well air moves in and out of your lungs.    Your physician has recommended that you wear a holter monitor. Holter monitors are medical devices that record the heart's electrical activity. Doctors most often use these monitors to diagnose arrhythmias. Arrhythmias are problems with the speed or rhythm of the heartbeat. The monitor is a small, portable device. You can wear one while you do your normal daily activities. This is usually used to diagnose what is causing palpitations/syncope (passing out).   If you need a refill on your cardiac medications before your next appointment, please call your pharmacy.    Thank you for choosing [facility] !

## 2015-10-15 NOTE — Progress Notes (Signed)
Patient ID: Melissa Levy, female   DOB: 1966/07/10, 50 y.o.   MRN: [id number]     Clinical Summary Melissa Levy is a 50 y.o.female seen today as an add on patient for SOB, she is a regular patient of Dr Ladona Ridgel  1. SVT - history of prior SVT ablation by Dr Ladona Ridgel 05/2014   2. Chest pain/SOB - symptoms started on Saturday while at home. Feeling of tightness in chest, worst with deep breath. Tightness in midchest, 3/10. + SOB, even just with talking. Felt lightheaded with standing. Had some mild palpitations. Tightness was constant over the weekend without relief. Still with ongoing chest tightness.  - denies any fevers, chills. No LE edema.  - ER visit with negative workup incluing CT PE negative for clot, trop neg, EKG no ischemic changes. Was started on daily ASA at that time and outpatient cardiology follow up arranged - 10/2015 Lexiscan MPI without ischemia.  - no smoking history, but significant second hand smoke exposure. Reports history of "summertime asthma", but not on any inhalers   Past Medical History  Diagnosis Date  . Thyroid disease     Graves; now hypothyroid  . Hyperlipidemia   . SVT (supraventricular tachycardia) (HCC)   . Complication of anesthesia   . PONV (postoperative nausea and vomiting)   . Dysrhythmia     HX OF SVT  . GERD (gastroesophageal reflux disease)   . Graves disease   . Headache      Allergies  Allergen Reactions  . Dilaudid [Hydromorphone Hcl] Hives and Itching     Current Outpatient Prescriptions  Medication Sig Dispense Refill  . ALPRAZolam (XANAX) 0.25 MG tablet Take 0.25 mg by mouth daily as needed for anxiety.     Marland Kitchen ibuprofen (ADVIL,MOTRIN) 800 MG tablet Take 1 tablet (800 mg total) by mouth every 8 (eight) hours as needed for mild pain. 30 tablet 0  . magnesium oxide (MAG-OX) 400 MG tablet Take 400 mg by mouth at bedtime.     Marland Kitchen omeprazole (PRILOSEC) 20 MG capsule Take 20 mg by mouth daily.    Marland Kitchen SYNTHROID 125 MCG tablet Take 125 mcg by  mouth daily before breakfast.     . venlafaxine XR (EFFEXOR-XR) 37.5 MG 24 hr capsule Take 75 mg by mouth daily with breakfast.     . ZETIA 10 MG tablet Take 1 tablet by mouth at bedtime.   3   No current facility-administered medications for this visit.     Past Surgical History  Procedure Laterality Date  . Back surgery    . Abdominal hysterectomy    . Thyroidectomy    . Ablation of dysrhythmic focus  05/12/2014    DR Ladona Ridgel  . Supraventricular tachycardia ablation N/A 05/12/2014    Procedure: SUPRAVENTRICULAR TACHYCARDIA ABLATION;  Surgeon: Marinus Maw, MD;  Location: [facility] CATH LAB;  Service: Cardiovascular;  Laterality: N/A;  . Tubal ligation    . Lasik       Allergies  Allergen Reactions  . Dilaudid [Hydromorphone Hcl] Hives and Itching      Family History  Problem Relation Age of Onset  . Heart disease Father     Atrial fibrillation; ablation  . Heart disease Paternal Aunt   . Heart disease Paternal Uncle      Social History Melissa Levy reports that she has never smoked. She has never used smokeless tobacco. Melissa Levy reports that she drinks alcohol.   Review of Systems CONSTITUTIONAL: No weight loss, fever, chills, weakness or  fatigue.  HEENT: Eyes: No visual loss, blurred vision, double vision or yellow sclerae.No hearing loss, sneezing, congestion, runny nose or sore throat.  SKIN: No rash or itching.  CARDIOVASCULAR: per HPI RESPIRATORY: No shortness of breath, cough or sputum.  GASTROINTESTINAL: No anorexia, nausea, vomiting or diarrhea. No abdominal pain or blood.  GENITOURINARY: No burning on urination, no polyuria NEUROLOGICAL: No headache, dizziness, syncope, paralysis, ataxia, numbness or tingling in the extremities. No change in bowel or bladder control.  MUSCULOSKELETAL: No muscle, back pain, joint pain or stiffness.  LYMPHATICS: No enlarged nodes. No history of splenectomy.  PSYCHIATRIC: No history of depression or anxiety.  ENDOCRINOLOGIC: No  reports of sweating, cold or heat intolerance. No polyuria or polydipsia.  Marland Kitchen.   Physical Examination Filed Vitals:   10/15/15 1109  BP: 108/76  Pulse: 99   Filed Vitals:   10/15/15 1109  Height: 5\' 9"  (1.753 m)  Weight: 228 lb (103.42 kg)    Gen: resting comfortably, no acute distress HEENT: no scleral icterus, pupils equal round and reactive, no palptable cervical adenopathy,  CV: RRR, no m/r/g, no jvd Resp: Clear to auscultation bilaterally GI: abdomen is soft, non-tender, non-distended, normal bowel sounds, no hepatosplenomegaly MSK: extremities are warm, no edema.  Skin: warm, no rash Neuro:  no focal deficits Psych: appropriate affect   Diagnostic Studies 10/2015 Lexiscan MPI  There was no ST segment deviation noted during stress.  The study is normal.  This is a low risk study.  Nuclear stress EF: 76%.   04/2014 Echo Study Conclusions  - Left ventricle: The cavity size was normal. Systolic function was normal. The estimated ejection fraction was in the range of 55% to 60%. Wall motion was normal; there were no regional wall motion abnormalities. There was an increased relative contribution of atrial contraction to ventricular filling. Doppler parameters are consistent with abnormal left ventricular relaxation (grade 1 diastolic dysfunction).   10/2015 CT PE IMPRESSION: No demonstrable pulmonary embolus.  No lung edema or consolidation. Small nodular opacities likely represent small lymph nodes. Followup of these nodular opacity should be based on Fleischner Society guidelines. If the patient is at high risk for bronchogenic carcinoma, follow-up chest CT at 3-876months is recommended. If the patient is at low risk for bronchogenic carcinoma, follow-up chest CT at 6-12 months is recommended. This recommendation follows the consensus statement: Guidelines for Management of Small Pulmonary Nodules Detected on CT Scans: A Statement from the  Fleischner Society as published in Radiology 2005; 237:395-400.  Areas of adenopathy. Question sarcoidosis, although neoplastic etiology cannot be excluded.  Patient is status post thyroidectomy.  Hepatic steatosis.  Assessment and Plan  1. Chest pain/SOB - ER workup negative including EKG, troponins, and CT PE. Lexiscan  Negative for ischemia - etiology of symptoms remain unclear, consider potential pulmonary disease vs symptomatic arrhythmia. Will ordder 24 hr holter as well as PFTs, as she does have at least some intermittent asthma by her reported history - start albuterol prn   2. Lung nodules - this will need to be followed by her primary, would need f/u CT in 6-12 months.    F/u 3-4 weeks      Antoine PocheJonathan F. Daray Polgar, M.D.

## 2015-10-22 ENCOUNTER — Ambulatory Visit (HOSPITAL_COMMUNITY)
Admission: RE | Admit: 2015-10-22 | Discharge: 2015-10-22 | Disposition: A | Discharge status: Home / Self Care | Payer: BLUE CROSS/BLUE SHIELD | Source: Ambulatory Visit | Attending: Cardiology | Admitting: Cardiology

## 2015-10-22 DIAGNOSIS — R0602 Shortness of breath: Secondary | ICD-10-CM | POA: Insufficient documentation

## 2015-10-22 LAB — PULMONARY FUNCTION TEST
DL/VA % pred: 83 %
DL/VA: 4.45 ml/min/mmHg/L
DLCO unc % pred: 66 %
DLCO unc: 20.47 ml/min/mmHg
FEF 25-75 Post: 3.04 L/s
FEF 25-75 Pre: 3.18 L/s
FEF2575-%Change-Post: -4 %
FEF2575-%Pred-Post: 96 %
FEF2575-%Pred-Pre: 100 %
FEV1-%Change-Post: 1 %
FEV1-%Pred-Post: 86 %
FEV1-%Pred-Pre: 84 %
FEV1-Post: 2.88 L
FEV1-Pre: 2.84 L
FEV1FVC-%Change-Post: 2 %
FEV1FVC-%Pred-Pre: 101 %
FEV6-%Change-Post: -1 %
FEV6-%Pred-Post: 83 %
FEV6-%Pred-Pre: 83 %
FEV6-Post: 3.42 L
FEV6-Pre: 3.46 L
FEV6FVC-%Change-Post: 0 %
FEV6FVC-%Pred-Post: 101 %
FEV6FVC-%Pred-Pre: 101 %
FVC-%Change-Post: 0 %
FVC-%Pred-Post: 81 %
FVC-%Pred-Pre: 82 %
FVC-Post: 3.45 L
FVC-Pre: 3.48 L
Post FEV1/FVC ratio: 83 %
Post FEV6/FVC ratio: 100 %
Pre FEV1/FVC ratio: 82 %
Pre FEV6/FVC Ratio: 100 %
RV % pred: 58 %
RV: 1.16 L
TLC % pred: 80 %
TLC: 4.69 L

## 2015-10-22 MED ORDER — ALBUTEROL SULFATE (2.5 MG/3ML) 0.083% IN NEBU
2.5000 mg | INHALATION_SOLUTION | Freq: Once | RESPIRATORY_TRACT | Status: AC
  Administered 2015-10-22: 2.5 mg via RESPIRATORY_TRACT

## 2015-10-28 ENCOUNTER — Ambulatory Visit (INDEPENDENT_AMBULATORY_CARE_PROVIDER_SITE_OTHER): Payer: BLUE CROSS/BLUE SHIELD | Admitting: Internal Medicine

## 2015-10-28 ENCOUNTER — Encounter: Payer: Self-pay | Admitting: Internal Medicine

## 2015-10-28 VITALS — BP 124/84 | HR 91 | Ht 69.0 in | Wt 229.4 lb

## 2015-10-28 DIAGNOSIS — R918 Other nonspecific abnormal finding of lung field: Secondary | ICD-10-CM | POA: Diagnosis not present

## 2015-10-28 DIAGNOSIS — R0600 Dyspnea, unspecified: Secondary | ICD-10-CM | POA: Diagnosis not present

## 2015-10-28 NOTE — Progress Notes (Signed)
Subjective:    Patient ID: Melissa Levy, female    DOB: 21-Feb-1966,   MRN: [id number]  HPI  22 yowf never smoker with dx of "allergies" with mostly airway symptoms pos skin tests for dust/ animals in [place] as child never took shots seemed to do better until mid 90's came to [facility] and noted  symptoms lots of rhinitis seasonal spring and fall then since around 2010 started noting problems breathing worse  in summer better with saba prn then around first of 2017 abruptly devloped chest pressure/sob/lightheaded > ER 2 day after onset > no better p inhaler so referred to pulmonary clinic 10/28/2015 by Dr Wyline Mood with neg cards w/u for cp including CTa neg for PE but Pos for MPNs 10/11/15   10/28/2015 1st [facility] office visit/ Melissa Levy   Chief Complaint  Patient presents with  . Pulmonary Consult    Referred by Dr. Wyline Mood. Pt c/o SOB x 2 wks- occurs with walking short distances and talking.    prilosec 20 mg daily one hour before bfast maint rx x years s overt HB No sob at rest but worse at rest talking / sob stops her  x hundred feet flat nl pace, can't do steps s sob.  No obvious   day to day or daytime variabilty or assoc chronic cough or cp or chest tightness, subjective wheeze or overt sinus  symptoms. No unusual exp hx or h/o childhood pna/ asthma or knowledge of premature birth.  Sleeping ok without nocturnal  or early am exacerbation  of respiratory  c/o's or need for noct saba. Also denies any obvious fluctuation of symptoms with weather or environmental changes or other aggravating or alleviating factors except as outlined above   Current Medications, Allergies, Complete Past Medical History, Past Surgical History, Family History, and Social History were reviewed in [employer] record.           Review of Systems  Constitutional: Positive for appetite change. Negative for fever, chills and unexpected weight change.  HENT: Negative for congestion, dental  problem, ear pain, nosebleeds, postnasal drip, rhinorrhea, sinus pressure, sneezing, sore throat, trouble swallowing and voice change.   Eyes: Negative for visual disturbance.  Respiratory: Positive for shortness of breath. Negative for cough and choking.   Cardiovascular: Negative for chest pain and leg swelling.  Gastrointestinal: Negative for vomiting, abdominal pain and diarrhea.  Genitourinary: Negative for difficulty urinating.  Musculoskeletal: Negative for arthralgias.  Skin: Negative for rash.  Neurological: Negative for tremors, syncope and headaches.  Hematological: Does not bruise/bleed easily.       Objective:   Physical Exam  amb wf nad  Wt Readings from Last 3 Encounters:  10/28/15 229 lb 6.4 oz (104.055 kg)  10/15/15 228 lb (103.42 kg)  10/12/15 229 lb (103.874 kg)    Vital signs reviewed    HEENT: nl dentition, turbinates, and oropharynx. Nl external ear canals without cough reflex   NECK :  without JVD/Nodes/TM/ nl carotid upstrokes bilaterally   LUNGS: no acc muscle use,  Nl contour chest which is clear to A and P bilaterally without cough on insp or exp maneuvers   CV:  RRR  no s3 or murmur or increase in P2, no edema   ABD:  soft and nontender with nl inspiratory excursion in the supine position. No bruits or organomegaly, bowel sounds nl  MS:  Nl gait/ ext warm without deformities, calf tenderness, cyanosis or clubbing No obvious joint restrictions  SKIN: warm and dry without lesions    NEURO:  alert, approp, nl sensorium with  no motor deficits     I personally reviewed images and agree with radiology impression as follows:  CXR:  10/11/15  No demonstrable pulmonary embolus. No lung edema or consolidation. Small nodular opacities likely represent small lymph nodes. Followup of these nodular opacity should be based on Fleischner Society guidelines. If the patient is at high risk for bronchogenic carcinoma, follow-up chest CT at 3-45months is  recommended. If the patient is at low risk for bronchogenic carcinoma, follow-up chest CT at 6-12 months is Recommended.         Assessment & Plan:

## 2015-10-28 NOTE — Assessment & Plan Note (Signed)
CTa 10/11/15 largest 7x 6 with non specific adenopathy > placed in tickle file for recall 09/07/16   CT results reviewed with pt >>> Too small for PET or bx, not suspicious enough for excisional bx > really only option for now is follow the Fleischner society guidelines as rec by radiology.   Discussed in detail all the  indications, usual  risks and alternatives  relative to the benefits with patient who agrees to proceed with conservative f/u as outlined with ct before end of year, repeat sooner if symptoms don't resolve

## 2015-10-28 NOTE — Patient Instructions (Signed)
Prilosec 40 mg Take 30-60 min before first meal of the day and pepcid ac 20 mg at bedtime x minimum of a month   GERD (REFLUX)  is an extremely common cause of respiratory symptoms just like yours , many times with no obvious heartburn at all.    It can be treated with medication, but also with lifestyle changes including elevation of the head of your bed (ideally with 6 inch  bed blocks),  Smoking cessation, avoidance of late meals, excessive alcohol, and avoid fatty foods, chocolate, peppermint, colas, red wine, and acidic juices such as orange juice.  NO MINT OR MENTHOL PRODUCTS SO NO COUGH DROPS  USE SUGARLESS CANDY INSTEAD (Jolley ranchers or Stover's or Life Savers) or even ice chips will also do - the key is to swallow to prevent all throat clearing. NO OIL BASED VITAMINS - use powdered substitutes.  If not gradually improving the next step a cpst with spirometry before and after.  We will call you for CT before end of year.

## 2015-10-28 NOTE — Assessment & Plan Note (Signed)
CTa 10/11/15 neg pe/ no ild - PFTs 10/22/15 nl except ERV 59% - 10/28/2015  Walked RA x 3 laps @ 185 ft each stopped due to  End of study, nl pace, no sob or desat    Symptoms are markedly disproportionate to objective findings and not clear this is a lung problem but pt does appear to have difficult airway management issues. DDX of  difficult airways management almost all start with A and  include Adherence, Ace Inhibitors, Acid Reflux, Active Sinus Disease, Alpha 1 Antitripsin deficiency, Anxiety masquerading as Airways dz,  ABPA,  Allergy(esp in young), Aspiration (esp in elderly), Adverse effects of meds,  Active smokers, A bunch of PE's (a small clot burden can't cause this syndrome unless there is already severe underlying pulm or vascular dz with poor reserve) plus two Bs  = Bronchiectasis and Beta blocker use..and one C= CHF  Adherence is always the initial "prime suspect" and is a multilayered concern that requires a "trust but verify" approach in every patient - starting with knowing how to use medications, especially inhalers, correctly, keeping up with refills and understanding the fundamental difference between maintenance and prns vs those medications only taken for a very short course and then stopped and not refilled.   ? Acid (or non-acid) GERD > always difficult to exclude as up to 75% of pts in some series report no assoc GI/ Heartburn symptoms> rec max (24h)  acid suppression and diet restrictions/ reviewed and instructions given in writing.   ? Anxiety > dx of exclusion but higher here because all the other usual suspects have been excluded  rec restart regular paced sub max ex and proceed with cpst with pfts before and after if pattern persists  I had an extended discussion with the patient reviewing all relevant studies completed to date and  lasting 35 minutes of a 60 minute initial office visit    Each maintenance medication was reviewed in detail including most importantly the  difference between maintenance and prns and under what circumstances the prns are to be triggered using an action plan format that is not reflected in the computer generated alphabetically organized AVS.    Please see instructions for details which were reviewed in writing and the patient given a copy highlighting the part that I personally wrote and discussed at today's ov.

## 2015-11-12 ENCOUNTER — Ambulatory Visit (INDEPENDENT_AMBULATORY_CARE_PROVIDER_SITE_OTHER): Payer: BLUE CROSS/BLUE SHIELD | Admitting: Adult Health

## 2015-11-12 ENCOUNTER — Encounter: Payer: Self-pay | Admitting: Adult Health

## 2015-11-12 VITALS — BP 108/76 | HR 86 | Ht 69.0 in | Wt 230.0 lb

## 2015-11-12 DIAGNOSIS — R0600 Dyspnea, unspecified: Secondary | ICD-10-CM | POA: Diagnosis not present

## 2015-11-12 NOTE — Progress Notes (Deleted)
Name: Melissa Levy    DOB: Nov 20, 1965  Age: 50 y.o.  MR#: [id number]       PCP:  REDMON,NOELLE, PA-C      Insurance: Payor: BLUE CROSS BLUE SHIELD / Plan: BCBS OTHER / Product Type: *No Product type* /   CC:   No chief complaint on file.   VS Filed Vitals:   11/12/15 1545  BP: 108/76  Pulse: 86  Height:  (1.753 m)  Weight: 230 lb (104.327 kg)  SpO2: 96%    Weights Current Weight  11/12/15 230 lb (104.327 kg)  10/28/15 229 lb 6.4 oz (104.055 kg)  10/15/15 228 lb (103.42 kg)    Blood Pressure  BP Readings from Last 3 Encounters:  11/12/15 108/76  10/28/15 124/84  10/15/15 108/76     Admit date:  (Not on file) Last encounter with RMR:  Visit date not found   Allergy Dilaudid  Current Outpatient Prescriptions  Medication Sig Dispense Refill  . albuterol (PROVENTIL HFA;VENTOLIN HFA) 108 (90 Base) MCG/ACT inhaler Inhale 2 puffs into the lungs every 6 (six) hours as needed for wheezing or shortness of breath. 1 Inhaler 2  . ALPRAZolam (XANAX) 0.25 MG tablet Take 0.25 mg by mouth daily as needed for anxiety.     . famotidine (PEPCID) 20 MG tablet Take 20 mg by mouth at bedtime.    Marland Kitchen ibuprofen (ADVIL,MOTRIN) 800 MG tablet Take 1 tablet (800 mg total) by mouth every 8 (eight) hours as needed for mild pain. 30 tablet 0  . magnesium oxide (MAG-OX) 400 MG tablet Take 400 mg by mouth at bedtime.     Marland Kitchen omeprazole (PRILOSEC) 40 MG capsule Take 40 mg by mouth daily.    Marland Kitchen SYNTHROID 125 MCG tablet Take 125 mcg by mouth daily before breakfast.     . venlafaxine XR (EFFEXOR-XR) 37.5 MG 24 hr capsule Take 75 mg by mouth daily with breakfast.     . ZETIA 10 MG tablet Take 1 tablet by mouth at bedtime.   3   No current facility-administered medications for this visit.    Discontinued Meds:    Medications Discontinued During This Encounter  Medication Reason  . omeprazole (PRILOSEC) 20 MG capsule Error    Patient Active Problem List   Diagnosis Date Noted  . Dyspnea 10/28/2015  .  Multiple pulmonary nodules determined by computed tomography of lung 10/28/2015  . OSA (obstructive sleep apnea) 06/12/2014  . Paroxysmal supraventricular tachycardia (HCC) 05/12/2014  . SVT (supraventricular tachycardia) (HCC) 04/22/2014  . Chest pain 04/22/2014  . Elevated glucose 04/22/2014  . Elevated white blood cell count 04/22/2014    LABS    Component Value Date/Time   NA 136 10/11/2015 1300   NA 134* 05/06/2014 0824   NA 135 04/22/2014 0914   K 3.6 10/11/2015 1300   K 4.2 05/06/2014 0824   K 4.5 04/22/2014 0914   CL 104 10/11/2015 1300   CL 101 05/06/2014 0824   CL 102 04/22/2014 0914   CO2 23 10/11/2015 1300   CO2 27 05/06/2014 0824   CO2 25 04/22/2014 0914   GLUCOSE 140* 10/11/2015 1300   GLUCOSE 110* 05/06/2014 0824   GLUCOSE 98 04/22/2014 0914   BUN 15 10/11/2015 1300   BUN 13 05/06/2014 0824   BUN 15 04/22/2014 0914   CREATININE 0.78 10/11/2015 1300   CREATININE 0.6 05/06/2014 0824   CREATININE 0.63 04/22/2014 0914   CREATININE 0.80 04/18/2014 0210   CALCIUM 9.2 10/11/2015 1300  CALCIUM 8.7 05/06/2014 0824   CALCIUM 8.8 04/22/2014 0914   GFRNONAA >60 10/11/2015 1300   GFRNONAA 87* 04/18/2014 0210   GFRAA >60 10/11/2015 1300   GFRAA >90 04/18/2014 0210   CMP     Component Value Date/Time   NA 136 10/11/2015 1300   K 3.6 10/11/2015 1300   CL 104 10/11/2015 1300   CO2 23 10/11/2015 1300   GLUCOSE 140* 10/11/2015 1300   BUN 15 10/11/2015 1300   CREATININE 0.78 10/11/2015 1300   CREATININE 0.63 04/22/2014 0914   CALCIUM 9.2 10/11/2015 1300   PROT 7.5 10/11/2015 1300   ALBUMIN 4.1 10/11/2015 1300   AST 23 10/11/2015 1300   ALT 39 10/11/2015 1300   ALKPHOS 78 10/11/2015 1300   BILITOT 0.5 10/11/2015 1300   GFRNONAA >60 10/11/2015 1300   GFRAA >60 10/11/2015 1300       Component Value Date/Time   WBC 11.0* 10/11/2015 1300   WBC 9.3 05/06/2014 0824   WBC 7.4 04/22/2014 0914   HGB 14.1 10/11/2015 1300   HGB 12.9 05/06/2014 0824   HGB 13.6  04/22/2014 0914   HCT 40.7 10/11/2015 1300   HCT 37.4 05/06/2014 0824   HCT 37.9 04/22/2014 0914   MCV 84.6 10/11/2015 1300   MCV 86.7 05/06/2014 0824   MCV 82.9 04/22/2014 0914    Lipid Panel  No results found for: CHOL, TRIG, HDL, CHOLHDL, VLDL, LDLCALC, LDLDIRECT  ABG No results found for: PHART, PCO2ART, PO2ART, HCO3, TCO2, ACIDBASEDEF, O2SAT   No results found for: TSH BNP (last 3 results)  Recent Labs  10/11/15 1300  BNP 6.0    ProBNP (last 3 results) No results for input(s): PROBNP in the last 8760 hours.  Cardiac Panel (last 3 results) No results for input(s): CKTOTAL, CKMB, TROPONINI, RELINDX in the last 72 hours.  Iron/TIBC/Ferritin/ %Sat No results found for: IRON, TIBC, FERRITIN, IRONPCTSAT   EKG Orders placed or performed in visit on 10/12/15  . EKG 12-Lead     Prior Assessment and Plan Problem List as of 11/12/2015      Cardiovascular and Mediastinum   SVT (supraventricular tachycardia) [facility])   Last Assessment & Plan 04/23/2014 Office Visit Written 04/23/2014  5:42 PM by Marinus Maw, MD    Her symptoms appear to be worsened. She has just started beta blockers. I have asked her to call us if she is intolerant of her meds or has had recurrent SVT despite medical therapy. If so, catheter ablation would be warranted.       Paroxysmal supraventricular tachycardia [facility])   Last Assessment & Plan 06/12/2014 Office Visit Written 06/12/2014  1:03 PM by Marinus Maw, MD    She is s/p ablation and doing well. I have recommended a period of watchful waiting.         Respiratory   OSA (obstructive sleep apnea)   Last Assessment & Plan 03/05/2015 Office Visit Written 03/05/2015  9:15 AM by Barbaraann Share, MD    The patient has done very well since starting on C Pap, and has seen a significant improvement in her sleep and daytime alertness. She is having no issues with her pressure or mask fit, and her download today shows excellent control of her sleep apnea.  I have  asked her to try and increase her sleep time on the device, and also to work aggressively on weight reduction. She will follow-up in 6 months to check on her progress.  Other   Chest pain   Last Assessment & Plan 04/22/2014 Office Visit Written 04/22/2014  4:54 PM by Lewayne Bunting, MD    Patient has had persistent chest ache/tightness since her bout of SVT. There is some increased with inspiration. I will arrange a d-dimer to screen for pulmonary embolus. Check chest x-ray. There is increased with ambulation. Arrange exercise treadmill for risk stratification.      Elevated glucose   Last Assessment & Plan 04/22/2014 Office Visit Written 04/22/2014  4:54 PM by Lewayne Bunting, MD    Patient's glucose was elevated at 175 during a recent ER evaluation. Check hemoglobin A1c and if elevated followup with her primary care as she may be diabetic.      Elevated white blood cell count   Last Assessment & Plan 04/22/2014 Office Visit Written 04/22/2014  4:55 PM by Lewayne Bunting, MD    Repeat CBC and differential white blood cell count remains elevated followup primary care.      Dyspnea   Last Assessment & Plan 10/28/2015 Office Visit Written 10/28/2015  6:00 PM by Nyoka Cowden, MD    CTa 10/11/15 neg pe/ no ild - PFTs 10/22/15 nl except ERV 59% - 10/28/2015  Walked RA x 3 laps @ 185 ft each stopped due to  End of study, nl pace, no sob or desat    Symptoms are markedly disproportionate to objective findings and not clear this is a lung problem but pt does appear to have difficult airway management issues. DDX of  difficult airways management almost all start with A and  include Adherence, Ace Inhibitors, Acid Reflux, Active Sinus Disease, Alpha 1 Antitripsin deficiency, Anxiety masquerading as Airways dz,  ABPA,  Allergy(esp in young), Aspiration (esp in elderly), Adverse effects of meds,  Active smokers, A bunch of PE's (a small clot burden can't cause this syndrome unless there is already  severe underlying pulm or vascular dz with poor reserve) plus two Bs  = Bronchiectasis and Beta blocker use..and one C= CHF  Adherence is always the initial "prime suspect" and is a multilayered concern that requires a "trust but verify" approach in every patient - starting with knowing how to use medications, especially inhalers, correctly, keeping up with refills and understanding the fundamental difference between maintenance and prns vs those medications only taken for a very short course and then stopped and not refilled.   ? Acid (or non-acid) GERD > always difficult to exclude as up to 75% of pts in some series report no assoc GI/ Heartburn symptoms> rec max (24h)  acid suppression and diet restrictions/ reviewed and instructions given in writing.   ? Anxiety > dx of exclusion but higher here because all the other usual suspects have been excluded  rec restart regular paced sub max ex and proceed with cpst with pfts before and after if pattern persists  I had an extended discussion with the patient reviewing all relevant studies completed to date and  lasting 35 minutes of a 60 minute initial office visit    Each maintenance medication was reviewed in detail including most importantly the difference between maintenance and prns and under what circumstances the prns are to be triggered using an action plan format that is not reflected in the computer generated alphabetically organized AVS.    Please see instructions for details which were reviewed in writing and the patient given a copy highlighting the part that I personally wrote and discussed at today's ov.  Multiple pulmonary nodules determined by computed tomography of lung   Last Assessment & Plan 10/28/2015 Office Visit Written 10/28/2015  6:19 PM by Nyoka Cowden, MD    CTa 10/11/15 largest 7x 6 with non specific adenopathy > placed in tickle file for recall 09/07/16   CT results reviewed with pt >>> Too small for PET or bx, not  suspicious enough for excisional bx > really only option for now is follow the Fleischner society guidelines as rec by radiology.   Discussed in detail all the  indications, usual  risks and alternatives  relative to the benefits with patient who agrees to proceed with conservative f/u as outlined with ct before end of year, repeat sooner if symptoms don't resolve          Imaging: No results found.

## 2015-11-12 NOTE — Patient Instructions (Signed)
Your physician recommends that you schedule a follow-up appointment As Needed.   Your physician recommends that you continue on your current medications as directed. Please refer to the Current Medication list given to you today.  Thank you for choosing [facility]!    

## 2015-11-12 NOTE — Progress Notes (Signed)
Cardiology Office Note   Date:  11/12/2015   ID:  Melissa Levy, DOB [birth year], MRN [id number]  PCP:  REDMON,NOELLE, PA-C  Cardiologist: Arlington Calix, NP   No chief complaint on file.     History of Present Illness: Melissa Levy is a 50 y.o. female who presents for ongoing assessment and management of SVT, status post ablation by Dr. Ladona Ridgel on 8 2015, chronic chest pain, with associated shortness of breath.  LexiScan Myoview in January 2017 without ischemia.he was seen by Dr. Wyline Mood on 10/15/2015, a Holter monitor was placed on the patient to rule out symptomatic arrhythmia.  PFTs were also ordered, and she was started on albuterol when necessary.  It was noted, but she did have some pulmonary lung nodules and would need to have repeat CT in 6-12 months.  PFTs were completed on 10/22/2015, breathing tests were without any major abnormalities.  She is referred to her PCP, who may refer to pulmonologist for ongoing evaluation of dyspnea. Holter monitor completed on 10/22/2015 revealed heart function was normal, predominant rhythm was normal sinus rhythm, rare supraventricular and rare ventricular ectopy.  Reports symptoms of shortness of breath.  All correlate with normal sinus rhythm, no significant arrhythmias.  She has a, without any complaints.  She is been followed by her primary care physician, and has been diagnosed with GERD.  He increased her PPI from 20 mg a day to 40 mg a day and started her on Pepcid at at bedtime.  She has had significant improvement in her breathing status.  Past Medical History  Diagnosis Date  . Thyroid disease     Graves; now hypothyroid  . Hyperlipidemia   . SVT (supraventricular tachycardia) (HCC)   . Complication of anesthesia   . PONV (postoperative nausea and vomiting)   . Dysrhythmia     HX OF SVT  . GERD (gastroesophageal reflux disease)   . Graves disease   . Headache     Past Surgical History  Procedure Laterality Date  . Back  surgery    . Abdominal hysterectomy    . Thyroidectomy    . Ablation of dysrhythmic focus  05/12/2014    DR Ladona Ridgel  . Supraventricular tachycardia ablation N/A 05/12/2014    Procedure: SUPRAVENTRICULAR TACHYCARDIA ABLATION;  Surgeon: Marinus Maw, MD;  Location: [facility] CATH LAB;  Service: Cardiovascular;  Laterality: N/A;  . Tubal ligation    . Lasik       Current Outpatient Prescriptions  Medication Sig Dispense Refill  . albuterol (PROVENTIL HFA;VENTOLIN HFA) 108 (90 Base) MCG/ACT inhaler Inhale 2 puffs into the lungs every 6 (six) hours as needed for wheezing or shortness of breath. 1 Inhaler 2  . ALPRAZolam (XANAX) 0.25 MG tablet Take 0.25 mg by mouth daily as needed for anxiety.     . famotidine (PEPCID) 20 MG tablet Take 20 mg by mouth at bedtime.    Marland Kitchen ibuprofen (ADVIL,MOTRIN) 800 MG tablet Take 1 tablet (800 mg total) by mouth every 8 (eight) hours as needed for mild pain. 30 tablet 0  . magnesium oxide (MAG-OX) 400 MG tablet Take 400 mg by mouth at bedtime.     Marland Kitchen omeprazole (PRILOSEC) 40 MG capsule Take 40 mg by mouth daily.    Marland Kitchen SYNTHROID 125 MCG tablet Take 125 mcg by mouth daily before breakfast.     . venlafaxine XR (EFFEXOR-XR) 37.5 MG 24 hr capsule Take 75 mg by mouth daily with breakfast.     .  ZETIA 10 MG tablet Take 1 tablet by mouth at bedtime.   3   No current facility-administered medications for this visit.    Allergies:   Dilaudid    Social History:  The patient  reports that she has never smoked. She has never used smokeless tobacco. She reports that she drinks alcohol. She reports that she does not use illicit drugs.   Family History:  The patient's family history includes Breast cancer in her maternal grandmother and paternal grandmother; Emphysema in her paternal grandmother; Heart disease in her father, paternal aunt, and paternal uncle.    ROS: All other systems are reviewed and negative. Unless otherwise mentioned in H&P    PHYSICAL EXAM: VS:  BP 108/76  mmHg  Pulse 86  Ht  (1.753 m)  Wt 230 lb (104.327 kg)  BMI 33.95 kg/m2  SpO2 96% , BMI Body mass index is 33.95 kg/(m^2). GEN: Well nourished, well developed, in no acute distress Cardiac: RRR; no murmurs, rubs, or gallops,no edema  Respiratory:  clear to auscultation bilaterally, normal work of breathing Neuro:  Strength and sensation are intact Psych: euthymic mood, full affect   Recent Labs: 10/11/2015: ALT 39; B Natriuretic Peptide 6.0; BUN 15; Creatinine, Ser 0.78; Hemoglobin 14.1; Platelets 301; Potassium 3.6; Sodium 136    Lipid Panel No results found for: CHOL, TRIG, HDL, CHOLHDL, VLDL, LDLCALC, LDLDIRECT    Wt Readings from Last 3 Encounters:  11/12/15 230 lb (104.327 kg)  10/28/15 229 lb 6.4 oz (104.055 kg)  10/15/15 228 lb (103.42 kg)       ASSESSMENT AND PLAN:  1.  Dyspnea: All cardiac evaluation, symptomatic, normal to include a recent stress test.  Will not make any further recommendations from a cardiac standpoint.  We will see her on a when necessary basis.  2. OSA: she is back wearing her CPAP at night.  Stating that she can breathe better now and is able to tolerate it.  3. PSVT:this recent cardiac monitor was negative for recurrent SVT, and she has been asymptomatic.   Current medicines are reviewed at length with the patient today.    Labs/ tests ordered today include:  No orders of the defined types were placed in this encounter.     Disposition:   FU with PRN   Signed, Joni Reining, NP  11/12/2015 3:53 PM    [facility] [address] [place], [place] [address] Phone: [phone]; Fax: [phone]

## 2016-03-09 ENCOUNTER — Other Ambulatory Visit (HOSPITAL_COMMUNITY): Payer: Self-pay | Admitting: Physician Assistant

## 2016-03-09 ENCOUNTER — Other Ambulatory Visit: Payer: Self-pay | Admitting: Physician Assistant

## 2016-03-09 DIAGNOSIS — R918 Other nonspecific abnormal finding of lung field: Secondary | ICD-10-CM

## 2016-03-16 ENCOUNTER — Ambulatory Visit (HOSPITAL_COMMUNITY)
Admission: RE | Admit: 2016-03-16 | Discharge: 2016-03-16 | Disposition: A | Discharge status: Home / Self Care | Payer: BLUE CROSS/BLUE SHIELD | Source: Ambulatory Visit | Attending: Physician Assistant | Admitting: Physician Assistant

## 2016-03-16 DIAGNOSIS — R918 Other nonspecific abnormal finding of lung field: Secondary | ICD-10-CM | POA: Diagnosis present

## 2017-04-04 IMAGING — CT CT ANGIO CHEST
1 of 6 series · 5 of 36 positions shown · IV contrast (Omnipaque 300)
Comparison: Chest radiograph April 23, 2014

CLINICAL DATA: Shortness of breath for 2 days

EXAM:
CT ANGIOGRAPHY CHEST WITH CONTRAST
TECHNIQUE: Multidetector CT imaging of the chest was performed using the
standard protocol during bolus administration of intravenous
contrast. Multiplanar CT image reconstructions and MIPs were
obtained to evaluate the vascular anatomy.
CONTRAST:  100mL OMNIPAQUE IOHEXOL 350 MG/ML SOLN

[Series 7: pe 3.0 b40f · axial · 0.66mm/px · z∈[+1018,+1198]mm · 5 of 90 slices shown]
[im 15/90  lung]
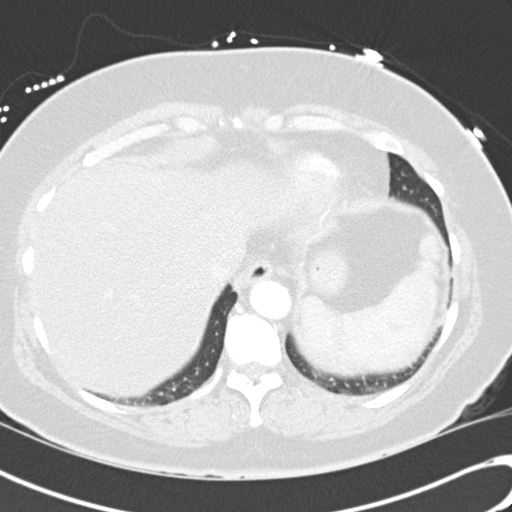
[im 30/90  mediastinal]
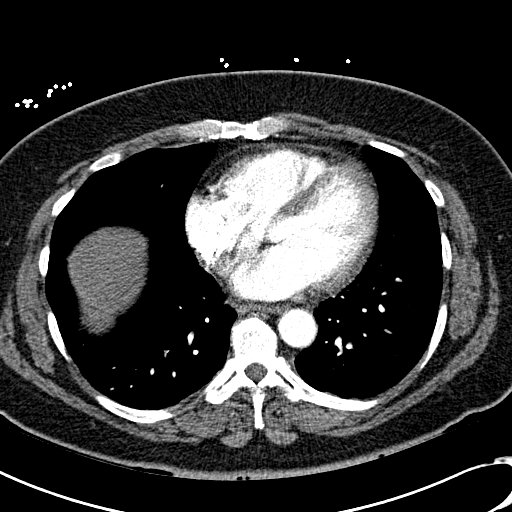
[im 45/90  lung]
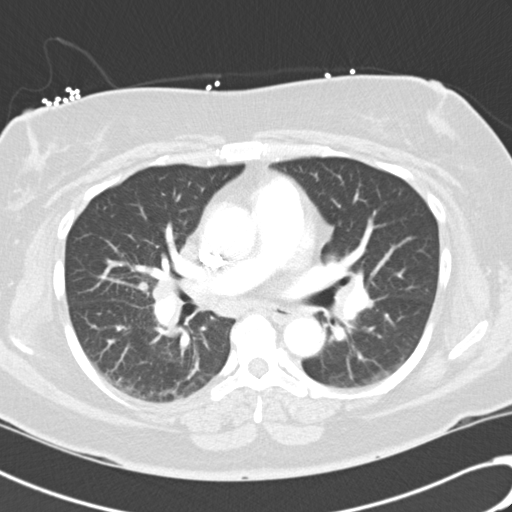
[im 60/90  mediastinal]
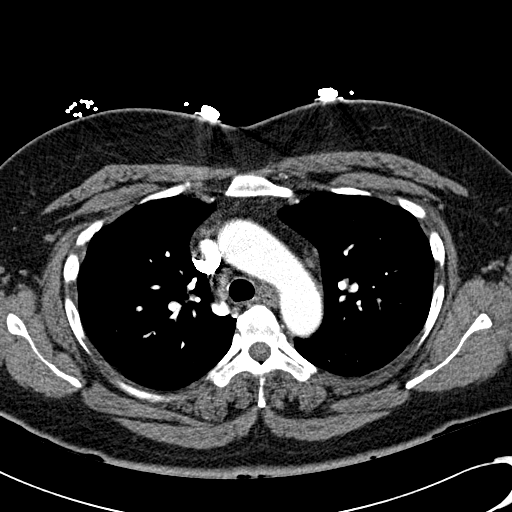
[im 75/90  lung]
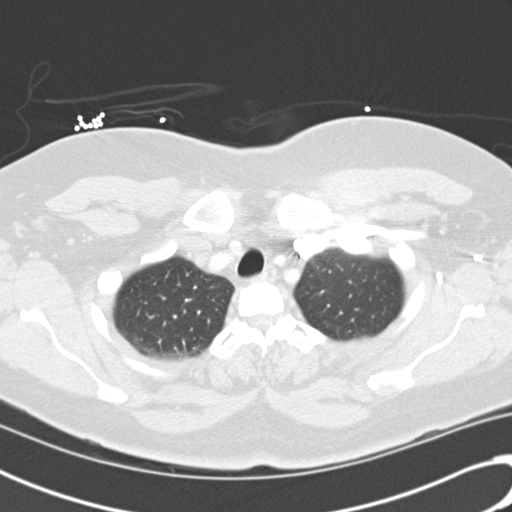

[5 of 36 positions shown; findings below may reference images not displayed]

FINDINGS: There is no demonstrable pulmonary embolus. Reflux of contrast into
the azygos vein is not felt to have clinical significance. There is
no thoracic aortic aneurysm or dissection. The visualized great
vessels appear normal.

There is no parenchymal lung edema or consolidation. There is slight
lung base atelectatic change bilaterally. On axial slice 28 series
8, there is a 7 x 6 mm nodular opacity abutting the major fissure in
the posterior segment left upper lobe. This nodular opacity may
represent a focal lymph node given other findings. A similar
probable lymph node is noted immediately adjacent to a posterior
segment right upper lobe bronchus, best seen on axial slice 32
series 7 and coronal slice 46 series 9 measuring 5 x 5 mm.

There are multiple areas of adenopathy in the thoracic region. The
largest individual lymph node measures 2.1 x 1.7 cm. There are
bilateral prominent lymph nodes in each hilar region with the
largest lymph node in the left hilum measuring 2.0 x 1.2 cm, and the
largest lymph node in the right hilum measuring 1.5 x 1.0 cm. There
are several borderline prominent right peritracheal lymph nodes as
well.

He thyroid is absent consistent with previous thyroidectomy.
Pericardium is not thickened.

In the visualized upper abdomen, there is hepatic steatosis.

There is degenerative change in the thoracic spine. There are no
blastic or lytic bone lesions.

Review of the MIP images confirms the above findings.
IMPRESSION: No demonstrable pulmonary embolus.

No lung edema or consolidation. Small nodular opacities likely
represent small lymph nodes. Followup of these nodular opacity
should be based on [HOSPITAL] guidelines. If the patient is
at high risk for bronchogenic carcinoma, follow-up chest CT at
3-5months is recommended. If the patient is at low risk for
bronchogenic carcinoma, follow-up chest CT at 6-12 months is
recommended. This recommendation follows the consensus statement:
Guidelines for Management of Small Pulmonary Nodules Detected on CT
Scans: A Statement from the [HOSPITAL] as published in

Areas of adenopathy. Question sarcoidosis, although neoplastic
etiology cannot be excluded.

Patient is status post thyroidectomy.

Hepatic steatosis.

## 2017-04-06 IMAGING — NM NM MYOCAR MULTI W/SPECT W/WALL MOTION & EF
2 series · 12 of 12 positions shown · non-contrast
Comparison: none

[Series 1: rest · 8.28mm/px · 6 of 64 frames shown]
[frame 6/64]
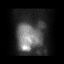
[frame 16/64]
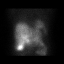
[frame 27/64]
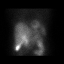
[frame 38/64]
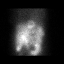
[frame 48/64]
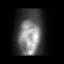
[frame 59/64]
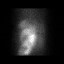

[Series 2: stress gated · 8.28mm/px · 6 of 64 frames shown]
[frame 6/64]
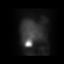
[frame 16/64]
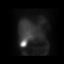
[frame 27/64]
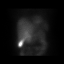
[frame 38/64]
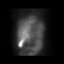
[frame 48/64]
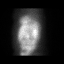
[frame 59/64]
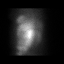

[12 of 12 positions shown; findings below may reference images not displayed]

Canned report from images found in remote index.

Refer to host system for actual result text.

## 2017-06-06 ENCOUNTER — Ambulatory Visit (INDEPENDENT_AMBULATORY_CARE_PROVIDER_SITE_OTHER): Payer: BLUE CROSS/BLUE SHIELD | Admitting: Orthopaedic Surgery

## 2017-06-06 ENCOUNTER — Ambulatory Visit (INDEPENDENT_AMBULATORY_CARE_PROVIDER_SITE_OTHER): Payer: Self-pay

## 2017-06-06 DIAGNOSIS — G8929 Other chronic pain: Secondary | ICD-10-CM

## 2017-06-06 DIAGNOSIS — M25561 Pain in right knee: Secondary | ICD-10-CM

## 2017-06-06 DIAGNOSIS — M25562 Pain in left knee: Secondary | ICD-10-CM

## 2017-06-06 NOTE — Progress Notes (Signed)
Office Visit Note   Patient: Melissa MinisterHolly P Szczesniak           Date of Birth: [birth year]           MRN: [id number] Visit Date: 06/06/2017              Requested by: Milus Heightedmon, Noelle, PA-C [address] [place], [place] [address] PCP: Milus Heightedmon, Noelle, PA-C   Assessment & Plan: Visit Diagnoses:  1. Chronic pain of right knee   2. Chronic pain of left knee   OA right knee  Plan: long discussion re dx and Rx options. Continue with NSAID's, support. Consider cortisone inj and/or MRI right knee in future. Could have a torn med men  Follow-Up Instructions: Return if symptoms worsen or fail to improve.   Orders:  Orders Placed This Encounter  Procedures  . XR KNEE 3 VIEW RIGHT   No orders of the defined types were placed in this encounter.     Procedures: No procedures performed   Clinical Data: No additional findings.   Subjective: No chief complaint on file. 10-15 yrs ago had tear of mneiscus dx'd elsewhere. Surgery scheduled but improved after lumbar fusion of L5-S1. Jan 2018 twisting injury to right knee with onset pain, swelling. Only recently felt better for no apparent reason. Still with a little soreness and minimal feeling of tightness  HPI  Review of Systems  Constitutional: Negative for chills, fatigue and fever.  Eyes: Negative for itching.  Respiratory: Negative for chest tightness and shortness of breath.   Cardiovascular: Negative for chest pain, palpitations and leg swelling.  Gastrointestinal: Negative for blood in stool, constipation and diarrhea.  Musculoskeletal: Positive for back pain and neck pain. Negative for joint swelling and neck stiffness.  Neurological: Negative for dizziness, weakness, numbness and headaches.  Hematological: Does not bruise/bleed easily.  Psychiatric/Behavioral: Negative for sleep disturbance. The patient is not nervous/anxious.      Objective: Vital Signs: There were no vitals taken for this visit.  Physical  Exam  Ortho Examminimal effusion right knee. Minimal med jt pain. No instability. No distal edema. N/V intact. RONM 0-110.SLR neg. Painless ROM right hip. Skin intacct  Specialty Comments:  No specialty comments available.  Imaging: Xr Knee 3 View Right  Result Date: 06/06/2017 Decreased med joint without ectopic calcification. 2-3 degrees of varus. Mild degfenerative changes about patella. Minimal osteophyte formation..findings consistent with mild to moderate OA    PMFS History: Patient Active Problem List   Diagnosis Date Noted  . Dyspnea 10/28/2015  . Multiple pulmonary nodules determined by computed tomography of lung 10/28/2015  . OSA (obstructive sleep apnea) 06/12/2014  . Paroxysmal supraventricular tachycardia (HCC) 05/12/2014  . SVT (supraventricular tachycardia) (HCC) 04/22/2014  . Chest pain 04/22/2014  . Elevated glucose 04/22/2014  . Elevated white blood cell count 04/22/2014   Past Medical History:  Diagnosis Date  . Complication of anesthesia   . Dysrhythmia    HX OF SVT  . GERD (gastroesophageal reflux disease)   . Graves disease   . Headache   . Hyperlipidemia   . PONV (postoperative nausea and vomiting)   . SVT (supraventricular tachycardia) (HCC)   . Thyroid disease    Graves; now hypothyroid    Family History  Problem Relation Age of Onset  . Heart disease Father        Atrial fibrillation; ablation  . Heart disease Paternal Aunt   . Heart disease Paternal Uncle   . Emphysema Paternal Grandmother        ?  if smoked  . Breast cancer Maternal Grandmother   . Breast cancer Paternal Grandmother   . Cervical cancer Unknown        ? Aunt or Grandmother    Past Surgical History:  Procedure Laterality Date  . ABDOMINAL HYSTERECTOMY    . ABLATION OF DYSRHYTHMIC FOCUS  05/12/2014   DR Ladona Ridgel  . BACK SURGERY    . LASIK    . SUPRAVENTRICULAR TACHYCARDIA ABLATION N/A 05/12/2014   Procedure: SUPRAVENTRICULAR TACHYCARDIA ABLATION;  Surgeon: Marinus Maw, MD;  Location: [facility] CATH LAB;  Service: Cardiovascular;  Laterality: N/A;  . THYROIDECTOMY    . TUBAL LIGATION     Social History   Occupational History  . admin assitant [employer]   Social History Main Topics  . Smoking status: Never Smoker  . Smokeless tobacco: Never Used  . Alcohol use 0.0 oz/week     Comment: Occasional  . Drug use: No  . Sexual activity: Not on file

## 2019-10-27 ENCOUNTER — Other Ambulatory Visit: Payer: Self-pay

## 2019-10-27 ENCOUNTER — Ambulatory Visit: Payer: Self-pay | Attending: Internal Medicine

## 2019-10-27 DIAGNOSIS — Z20822 Contact with and (suspected) exposure to covid-19: Secondary | ICD-10-CM | POA: Insufficient documentation

## 2019-10-28 LAB — NOVEL CORONAVIRUS, NAA: SARS-CoV-2, NAA: NOT DETECTED

## 2021-01-19 ENCOUNTER — Other Ambulatory Visit (HOSPITAL_COMMUNITY): Payer: Self-pay | Admitting: Physician Assistant

## 2021-01-19 DIAGNOSIS — Z1231 Encounter for screening mammogram for malignant neoplasm of breast: Secondary | ICD-10-CM

## 2021-01-27 ENCOUNTER — Other Ambulatory Visit: Payer: Self-pay

## 2021-01-27 ENCOUNTER — Other Ambulatory Visit (HOSPITAL_COMMUNITY): Payer: Self-pay | Admitting: Physician Assistant

## 2021-01-27 ENCOUNTER — Ambulatory Visit (HOSPITAL_COMMUNITY)
Admission: RE | Admit: 2021-01-27 | Discharge: 2021-01-27 | Disposition: A | Discharge status: Home / Self Care | Payer: 59 | Source: Ambulatory Visit | Attending: Physician Assistant | Admitting: Physician Assistant

## 2021-01-27 DIAGNOSIS — Z1231 Encounter for screening mammogram for malignant neoplasm of breast: Secondary | ICD-10-CM | POA: Diagnosis not present

## 2021-01-31 ENCOUNTER — Other Ambulatory Visit (HOSPITAL_COMMUNITY): Payer: Self-pay | Admitting: Physician Assistant

## 2021-01-31 DIAGNOSIS — R928 Other abnormal and inconclusive findings on diagnostic imaging of breast: Secondary | ICD-10-CM

## 2021-02-03 ENCOUNTER — Ambulatory Visit (HOSPITAL_COMMUNITY)
Admission: RE | Admit: 2021-02-03 | Discharge: 2021-02-03 | Disposition: A | Discharge status: Home / Self Care | Payer: 59 | Source: Ambulatory Visit | Attending: Physician Assistant | Admitting: Physician Assistant

## 2021-02-03 ENCOUNTER — Other Ambulatory Visit: Payer: Self-pay

## 2021-02-03 DIAGNOSIS — R928 Other abnormal and inconclusive findings on diagnostic imaging of breast: Secondary | ICD-10-CM | POA: Insufficient documentation

## 2021-02-07 ENCOUNTER — Other Ambulatory Visit (HOSPITAL_COMMUNITY): Payer: Self-pay | Admitting: Physician Assistant

## 2021-02-07 ENCOUNTER — Other Ambulatory Visit: Payer: Self-pay | Admitting: Physician Assistant

## 2021-02-07 DIAGNOSIS — N6489 Other specified disorders of breast: Secondary | ICD-10-CM

## 2021-02-07 DIAGNOSIS — R928 Other abnormal and inconclusive findings on diagnostic imaging of breast: Secondary | ICD-10-CM

## 2021-02-18 ENCOUNTER — Ambulatory Visit
Admission: RE | Admit: 2021-02-18 | Discharge: 2021-02-18 | Disposition: A | Discharge status: Home / Self Care | Payer: 59 | Source: Ambulatory Visit | Attending: Physician Assistant | Admitting: Physician Assistant

## 2021-02-18 ENCOUNTER — Other Ambulatory Visit: Payer: Self-pay

## 2021-02-18 DIAGNOSIS — N6489 Other specified disorders of breast: Secondary | ICD-10-CM

## 2021-02-18 DIAGNOSIS — R928 Other abnormal and inconclusive findings on diagnostic imaging of breast: Secondary | ICD-10-CM

## 2021-10-05 DIAGNOSIS — S134XXA Sprain of ligaments of cervical spine, initial encounter: Secondary | ICD-10-CM | POA: Diagnosis not present

## 2021-10-05 DIAGNOSIS — S233XXA Sprain of ligaments of thoracic spine, initial encounter: Secondary | ICD-10-CM | POA: Diagnosis not present

## 2021-10-05 DIAGNOSIS — R42 Dizziness and giddiness: Secondary | ICD-10-CM | POA: Diagnosis not present

## 2021-10-05 DIAGNOSIS — S338XXA Sprain of other parts of lumbar spine and pelvis, initial encounter: Secondary | ICD-10-CM | POA: Diagnosis not present

## 2021-10-12 DIAGNOSIS — S233XXA Sprain of ligaments of thoracic spine, initial encounter: Secondary | ICD-10-CM | POA: Diagnosis not present

## 2021-10-12 DIAGNOSIS — S134XXA Sprain of ligaments of cervical spine, initial encounter: Secondary | ICD-10-CM | POA: Diagnosis not present

## 2021-10-12 DIAGNOSIS — S338XXA Sprain of other parts of lumbar spine and pelvis, initial encounter: Secondary | ICD-10-CM | POA: Diagnosis not present

## 2021-10-12 DIAGNOSIS — R42 Dizziness and giddiness: Secondary | ICD-10-CM | POA: Diagnosis not present

## 2021-11-09 DIAGNOSIS — R42 Dizziness and giddiness: Secondary | ICD-10-CM | POA: Diagnosis not present

## 2021-11-09 DIAGNOSIS — S338XXA Sprain of other parts of lumbar spine and pelvis, initial encounter: Secondary | ICD-10-CM | POA: Diagnosis not present

## 2021-11-09 DIAGNOSIS — S134XXA Sprain of ligaments of cervical spine, initial encounter: Secondary | ICD-10-CM | POA: Diagnosis not present

## 2021-11-09 DIAGNOSIS — S233XXA Sprain of ligaments of thoracic spine, initial encounter: Secondary | ICD-10-CM | POA: Diagnosis not present

## 2021-11-16 DIAGNOSIS — R42 Dizziness and giddiness: Secondary | ICD-10-CM | POA: Diagnosis not present

## 2021-11-16 DIAGNOSIS — S134XXA Sprain of ligaments of cervical spine, initial encounter: Secondary | ICD-10-CM | POA: Diagnosis not present

## 2021-11-16 DIAGNOSIS — S233XXA Sprain of ligaments of thoracic spine, initial encounter: Secondary | ICD-10-CM | POA: Diagnosis not present

## 2021-11-16 DIAGNOSIS — S338XXA Sprain of other parts of lumbar spine and pelvis, initial encounter: Secondary | ICD-10-CM | POA: Diagnosis not present

## 2021-11-22 DIAGNOSIS — S134XXA Sprain of ligaments of cervical spine, initial encounter: Secondary | ICD-10-CM | POA: Diagnosis not present

## 2021-11-22 DIAGNOSIS — S338XXA Sprain of other parts of lumbar spine and pelvis, initial encounter: Secondary | ICD-10-CM | POA: Diagnosis not present

## 2021-11-22 DIAGNOSIS — R42 Dizziness and giddiness: Secondary | ICD-10-CM | POA: Diagnosis not present

## 2021-11-22 DIAGNOSIS — S233XXA Sprain of ligaments of thoracic spine, initial encounter: Secondary | ICD-10-CM | POA: Diagnosis not present

## 2021-12-06 DIAGNOSIS — S338XXA Sprain of other parts of lumbar spine and pelvis, initial encounter: Secondary | ICD-10-CM | POA: Diagnosis not present

## 2021-12-06 DIAGNOSIS — S233XXA Sprain of ligaments of thoracic spine, initial encounter: Secondary | ICD-10-CM | POA: Diagnosis not present

## 2021-12-06 DIAGNOSIS — S134XXA Sprain of ligaments of cervical spine, initial encounter: Secondary | ICD-10-CM | POA: Diagnosis not present

## 2021-12-06 DIAGNOSIS — R42 Dizziness and giddiness: Secondary | ICD-10-CM | POA: Diagnosis not present

## 2021-12-20 DIAGNOSIS — S233XXA Sprain of ligaments of thoracic spine, initial encounter: Secondary | ICD-10-CM | POA: Diagnosis not present

## 2021-12-20 DIAGNOSIS — S134XXA Sprain of ligaments of cervical spine, initial encounter: Secondary | ICD-10-CM | POA: Diagnosis not present

## 2021-12-20 DIAGNOSIS — R42 Dizziness and giddiness: Secondary | ICD-10-CM | POA: Diagnosis not present

## 2021-12-20 DIAGNOSIS — S338XXA Sprain of other parts of lumbar spine and pelvis, initial encounter: Secondary | ICD-10-CM | POA: Diagnosis not present

## 2022-01-03 DIAGNOSIS — S338XXA Sprain of other parts of lumbar spine and pelvis, initial encounter: Secondary | ICD-10-CM | POA: Diagnosis not present

## 2022-01-03 DIAGNOSIS — S134XXA Sprain of ligaments of cervical spine, initial encounter: Secondary | ICD-10-CM | POA: Diagnosis not present

## 2022-01-03 DIAGNOSIS — S233XXA Sprain of ligaments of thoracic spine, initial encounter: Secondary | ICD-10-CM | POA: Diagnosis not present

## 2022-01-03 DIAGNOSIS — R42 Dizziness and giddiness: Secondary | ICD-10-CM | POA: Diagnosis not present

## 2022-01-11 DIAGNOSIS — S338XXA Sprain of other parts of lumbar spine and pelvis, initial encounter: Secondary | ICD-10-CM | POA: Diagnosis not present

## 2022-01-11 DIAGNOSIS — S134XXA Sprain of ligaments of cervical spine, initial encounter: Secondary | ICD-10-CM | POA: Diagnosis not present

## 2022-01-11 DIAGNOSIS — S233XXA Sprain of ligaments of thoracic spine, initial encounter: Secondary | ICD-10-CM | POA: Diagnosis not present

## 2022-01-11 DIAGNOSIS — R42 Dizziness and giddiness: Secondary | ICD-10-CM | POA: Diagnosis not present

## 2022-01-12 DIAGNOSIS — R42 Dizziness and giddiness: Secondary | ICD-10-CM | POA: Diagnosis not present

## 2022-01-12 DIAGNOSIS — S134XXA Sprain of ligaments of cervical spine, initial encounter: Secondary | ICD-10-CM | POA: Diagnosis not present

## 2022-01-12 DIAGNOSIS — S338XXA Sprain of other parts of lumbar spine and pelvis, initial encounter: Secondary | ICD-10-CM | POA: Diagnosis not present

## 2022-01-12 DIAGNOSIS — S233XXA Sprain of ligaments of thoracic spine, initial encounter: Secondary | ICD-10-CM | POA: Diagnosis not present

## 2022-01-17 DIAGNOSIS — S233XXA Sprain of ligaments of thoracic spine, initial encounter: Secondary | ICD-10-CM | POA: Diagnosis not present

## 2022-01-17 DIAGNOSIS — S338XXA Sprain of other parts of lumbar spine and pelvis, initial encounter: Secondary | ICD-10-CM | POA: Diagnosis not present

## 2022-01-17 DIAGNOSIS — S134XXA Sprain of ligaments of cervical spine, initial encounter: Secondary | ICD-10-CM | POA: Diagnosis not present

## 2022-01-17 DIAGNOSIS — R42 Dizziness and giddiness: Secondary | ICD-10-CM | POA: Diagnosis not present

## 2022-02-02 DIAGNOSIS — S338XXA Sprain of other parts of lumbar spine and pelvis, initial encounter: Secondary | ICD-10-CM | POA: Diagnosis not present

## 2022-02-02 DIAGNOSIS — S134XXA Sprain of ligaments of cervical spine, initial encounter: Secondary | ICD-10-CM | POA: Diagnosis not present

## 2022-02-02 DIAGNOSIS — S233XXA Sprain of ligaments of thoracic spine, initial encounter: Secondary | ICD-10-CM | POA: Diagnosis not present

## 2022-02-02 DIAGNOSIS — R42 Dizziness and giddiness: Secondary | ICD-10-CM | POA: Diagnosis not present

## 2022-02-07 DIAGNOSIS — R350 Frequency of micturition: Secondary | ICD-10-CM | POA: Diagnosis not present

## 2022-02-07 DIAGNOSIS — H6122 Impacted cerumen, left ear: Secondary | ICD-10-CM | POA: Diagnosis not present

## 2022-02-07 DIAGNOSIS — R3915 Urgency of urination: Secondary | ICD-10-CM | POA: Diagnosis not present

## 2022-02-07 DIAGNOSIS — Z6831 Body mass index (BMI) 31.0-31.9, adult: Secondary | ICD-10-CM | POA: Diagnosis not present

## 2022-02-07 DIAGNOSIS — I10 Essential (primary) hypertension: Secondary | ICD-10-CM | POA: Diagnosis not present

## 2022-02-16 DIAGNOSIS — S134XXA Sprain of ligaments of cervical spine, initial encounter: Secondary | ICD-10-CM | POA: Diagnosis not present

## 2022-02-16 DIAGNOSIS — R42 Dizziness and giddiness: Secondary | ICD-10-CM | POA: Diagnosis not present

## 2022-02-16 DIAGNOSIS — S338XXA Sprain of other parts of lumbar spine and pelvis, initial encounter: Secondary | ICD-10-CM | POA: Diagnosis not present

## 2022-02-16 DIAGNOSIS — S233XXA Sprain of ligaments of thoracic spine, initial encounter: Secondary | ICD-10-CM | POA: Diagnosis not present

## 2022-02-23 DIAGNOSIS — Z1231 Encounter for screening mammogram for malignant neoplasm of breast: Secondary | ICD-10-CM | POA: Diagnosis not present

## 2022-03-02 DIAGNOSIS — S338XXA Sprain of other parts of lumbar spine and pelvis, initial encounter: Secondary | ICD-10-CM | POA: Diagnosis not present

## 2022-03-02 DIAGNOSIS — S134XXA Sprain of ligaments of cervical spine, initial encounter: Secondary | ICD-10-CM | POA: Diagnosis not present

## 2022-03-02 DIAGNOSIS — R42 Dizziness and giddiness: Secondary | ICD-10-CM | POA: Diagnosis not present

## 2022-03-02 DIAGNOSIS — S233XXA Sprain of ligaments of thoracic spine, initial encounter: Secondary | ICD-10-CM | POA: Diagnosis not present

## 2022-03-16 DIAGNOSIS — R42 Dizziness and giddiness: Secondary | ICD-10-CM | POA: Diagnosis not present

## 2022-03-16 DIAGNOSIS — S233XXA Sprain of ligaments of thoracic spine, initial encounter: Secondary | ICD-10-CM | POA: Diagnosis not present

## 2022-03-16 DIAGNOSIS — S338XXA Sprain of other parts of lumbar spine and pelvis, initial encounter: Secondary | ICD-10-CM | POA: Diagnosis not present

## 2022-03-16 DIAGNOSIS — S134XXA Sprain of ligaments of cervical spine, initial encounter: Secondary | ICD-10-CM | POA: Diagnosis not present

## 2022-03-30 DIAGNOSIS — R42 Dizziness and giddiness: Secondary | ICD-10-CM | POA: Diagnosis not present

## 2022-03-30 DIAGNOSIS — S338XXA Sprain of other parts of lumbar spine and pelvis, initial encounter: Secondary | ICD-10-CM | POA: Diagnosis not present

## 2022-03-30 DIAGNOSIS — S233XXA Sprain of ligaments of thoracic spine, initial encounter: Secondary | ICD-10-CM | POA: Diagnosis not present

## 2022-03-30 DIAGNOSIS — S134XXA Sprain of ligaments of cervical spine, initial encounter: Secondary | ICD-10-CM | POA: Diagnosis not present

## 2022-04-10 DIAGNOSIS — S338XXA Sprain of other parts of lumbar spine and pelvis, initial encounter: Secondary | ICD-10-CM | POA: Diagnosis not present

## 2022-04-10 DIAGNOSIS — S233XXA Sprain of ligaments of thoracic spine, initial encounter: Secondary | ICD-10-CM | POA: Diagnosis not present

## 2022-04-10 DIAGNOSIS — R42 Dizziness and giddiness: Secondary | ICD-10-CM | POA: Diagnosis not present

## 2022-04-10 DIAGNOSIS — S134XXA Sprain of ligaments of cervical spine, initial encounter: Secondary | ICD-10-CM | POA: Diagnosis not present

## 2022-04-24 DIAGNOSIS — S233XXA Sprain of ligaments of thoracic spine, initial encounter: Secondary | ICD-10-CM | POA: Diagnosis not present

## 2022-04-24 DIAGNOSIS — R42 Dizziness and giddiness: Secondary | ICD-10-CM | POA: Diagnosis not present

## 2022-04-24 DIAGNOSIS — S134XXA Sprain of ligaments of cervical spine, initial encounter: Secondary | ICD-10-CM | POA: Diagnosis not present

## 2022-04-24 DIAGNOSIS — S338XXA Sprain of other parts of lumbar spine and pelvis, initial encounter: Secondary | ICD-10-CM | POA: Diagnosis not present

## 2022-05-15 DIAGNOSIS — S233XXA Sprain of ligaments of thoracic spine, initial encounter: Secondary | ICD-10-CM | POA: Diagnosis not present

## 2022-05-15 DIAGNOSIS — R42 Dizziness and giddiness: Secondary | ICD-10-CM | POA: Diagnosis not present

## 2022-05-15 DIAGNOSIS — S338XXA Sprain of other parts of lumbar spine and pelvis, initial encounter: Secondary | ICD-10-CM | POA: Diagnosis not present

## 2022-05-15 DIAGNOSIS — S134XXA Sprain of ligaments of cervical spine, initial encounter: Secondary | ICD-10-CM | POA: Diagnosis not present

## 2022-05-29 DIAGNOSIS — R42 Dizziness and giddiness: Secondary | ICD-10-CM | POA: Diagnosis not present

## 2022-05-29 DIAGNOSIS — S134XXA Sprain of ligaments of cervical spine, initial encounter: Secondary | ICD-10-CM | POA: Diagnosis not present

## 2022-05-29 DIAGNOSIS — S233XXA Sprain of ligaments of thoracic spine, initial encounter: Secondary | ICD-10-CM | POA: Diagnosis not present

## 2022-05-29 DIAGNOSIS — S338XXA Sprain of other parts of lumbar spine and pelvis, initial encounter: Secondary | ICD-10-CM | POA: Diagnosis not present

## 2022-06-15 DIAGNOSIS — S338XXA Sprain of other parts of lumbar spine and pelvis, initial encounter: Secondary | ICD-10-CM | POA: Diagnosis not present

## 2022-06-15 DIAGNOSIS — R42 Dizziness and giddiness: Secondary | ICD-10-CM | POA: Diagnosis not present

## 2022-06-15 DIAGNOSIS — S233XXA Sprain of ligaments of thoracic spine, initial encounter: Secondary | ICD-10-CM | POA: Diagnosis not present

## 2022-06-15 DIAGNOSIS — S134XXA Sprain of ligaments of cervical spine, initial encounter: Secondary | ICD-10-CM | POA: Diagnosis not present

## 2022-08-04 DIAGNOSIS — S233XXA Sprain of ligaments of thoracic spine, initial encounter: Secondary | ICD-10-CM | POA: Diagnosis not present

## 2022-08-04 DIAGNOSIS — S338XXA Sprain of other parts of lumbar spine and pelvis, initial encounter: Secondary | ICD-10-CM | POA: Diagnosis not present

## 2022-08-04 DIAGNOSIS — R42 Dizziness and giddiness: Secondary | ICD-10-CM | POA: Diagnosis not present

## 2022-08-04 DIAGNOSIS — S134XXA Sprain of ligaments of cervical spine, initial encounter: Secondary | ICD-10-CM | POA: Diagnosis not present

## 2022-08-07 DIAGNOSIS — R030 Elevated blood-pressure reading, without diagnosis of hypertension: Secondary | ICD-10-CM | POA: Diagnosis not present

## 2022-08-07 DIAGNOSIS — R42 Dizziness and giddiness: Secondary | ICD-10-CM | POA: Diagnosis not present

## 2022-08-07 DIAGNOSIS — R69 Illness, unspecified: Secondary | ICD-10-CM | POA: Diagnosis not present

## 2022-08-07 DIAGNOSIS — Z6831 Body mass index (BMI) 31.0-31.9, adult: Secondary | ICD-10-CM | POA: Diagnosis not present

## 2022-08-07 DIAGNOSIS — H9203 Otalgia, bilateral: Secondary | ICD-10-CM | POA: Diagnosis not present

## 2022-08-08 DIAGNOSIS — S233XXA Sprain of ligaments of thoracic spine, initial encounter: Secondary | ICD-10-CM | POA: Diagnosis not present

## 2022-08-08 DIAGNOSIS — S338XXA Sprain of other parts of lumbar spine and pelvis, initial encounter: Secondary | ICD-10-CM | POA: Diagnosis not present

## 2022-08-08 DIAGNOSIS — S134XXA Sprain of ligaments of cervical spine, initial encounter: Secondary | ICD-10-CM | POA: Diagnosis not present

## 2022-08-08 DIAGNOSIS — R42 Dizziness and giddiness: Secondary | ICD-10-CM | POA: Diagnosis not present

## 2022-09-07 DIAGNOSIS — S338XXA Sprain of other parts of lumbar spine and pelvis, initial encounter: Secondary | ICD-10-CM | POA: Diagnosis not present

## 2022-09-07 DIAGNOSIS — R42 Dizziness and giddiness: Secondary | ICD-10-CM | POA: Diagnosis not present

## 2022-09-07 DIAGNOSIS — S134XXA Sprain of ligaments of cervical spine, initial encounter: Secondary | ICD-10-CM | POA: Diagnosis not present

## 2022-09-07 DIAGNOSIS — S233XXA Sprain of ligaments of thoracic spine, initial encounter: Secondary | ICD-10-CM | POA: Diagnosis not present

## 2022-09-21 DIAGNOSIS — S233XXA Sprain of ligaments of thoracic spine, initial encounter: Secondary | ICD-10-CM | POA: Diagnosis not present

## 2022-09-21 DIAGNOSIS — R42 Dizziness and giddiness: Secondary | ICD-10-CM | POA: Diagnosis not present

## 2022-09-21 DIAGNOSIS — S134XXA Sprain of ligaments of cervical spine, initial encounter: Secondary | ICD-10-CM | POA: Diagnosis not present

## 2022-09-21 DIAGNOSIS — S338XXA Sprain of other parts of lumbar spine and pelvis, initial encounter: Secondary | ICD-10-CM | POA: Diagnosis not present

## 2022-11-21 DIAGNOSIS — R7301 Impaired fasting glucose: Secondary | ICD-10-CM | POA: Diagnosis not present

## 2022-11-21 DIAGNOSIS — Z6832 Body mass index (BMI) 32.0-32.9, adult: Secondary | ICD-10-CM | POA: Diagnosis not present

## 2022-11-21 DIAGNOSIS — N951 Menopausal and female climacteric states: Secondary | ICD-10-CM | POA: Diagnosis not present

## 2022-11-21 DIAGNOSIS — R030 Elevated blood-pressure reading, without diagnosis of hypertension: Secondary | ICD-10-CM | POA: Diagnosis not present

## 2022-11-21 DIAGNOSIS — E039 Hypothyroidism, unspecified: Secondary | ICD-10-CM | POA: Diagnosis not present

## 2022-11-21 DIAGNOSIS — R69 Illness, unspecified: Secondary | ICD-10-CM | POA: Diagnosis not present

## 2022-12-26 DIAGNOSIS — Z6832 Body mass index (BMI) 32.0-32.9, adult: Secondary | ICD-10-CM | POA: Diagnosis not present

## 2022-12-26 DIAGNOSIS — R059 Cough, unspecified: Secondary | ICD-10-CM | POA: Diagnosis not present

## 2023-06-28 DIAGNOSIS — R300 Dysuria: Secondary | ICD-10-CM | POA: Diagnosis not present

## 2023-08-24 DIAGNOSIS — Z6829 Body mass index (BMI) 29.0-29.9, adult: Secondary | ICD-10-CM | POA: Diagnosis not present

## 2023-08-24 DIAGNOSIS — H9202 Otalgia, left ear: Secondary | ICD-10-CM | POA: Diagnosis not present

## 2023-08-24 DIAGNOSIS — R030 Elevated blood-pressure reading, without diagnosis of hypertension: Secondary | ICD-10-CM | POA: Diagnosis not present

## 2023-08-24 DIAGNOSIS — F419 Anxiety disorder, unspecified: Secondary | ICD-10-CM | POA: Diagnosis not present

## 2023-12-14 ENCOUNTER — Other Ambulatory Visit: Payer: Self-pay | Admitting: *Deleted

## 2023-12-14 DIAGNOSIS — Z1211 Encounter for screening for malignant neoplasm of colon: Secondary | ICD-10-CM

## 2023-12-18 ENCOUNTER — Ambulatory Visit (INDEPENDENT_AMBULATORY_CARE_PROVIDER_SITE_OTHER): Payer: Commercial Managed Care - HMO | Admitting: General Surgery

## 2023-12-18 ENCOUNTER — Encounter: Payer: Self-pay | Admitting: General Surgery

## 2023-12-18 VITALS — BP 105/73 | HR 70 | Temp 98.1°F | Resp 14 | Ht 69.0 in | Wt 208.0 lb

## 2023-12-18 DIAGNOSIS — Z1211 Encounter for screening for malignant neoplasm of colon: Secondary | ICD-10-CM

## 2023-12-18 MED ORDER — SUTAB 1479-225-188 MG PO TABS: 24.0000 | ORAL_TABLET | Freq: Once | ORAL | 0 refills | Status: AC

## 2023-12-19 NOTE — Addendum Note (Signed)
 Addended by: Phillips Odor on: 12/19/2023 02:39 PM   Modules accepted: Orders

## 2023-12-19 NOTE — Progress Notes (Signed)
 Melissa Levy; [id number]; [birth year]   HPI Patient is a 58 year old white female who was referred to my care by [name] for evaluation and treatment of a screening colonoscopy.  Patient has never had a colonoscopy.  She denies any family history of colon cancer.  She denies any abnormal abdominal pain, diarrhea, constipation, or weight change.  No blood per rectum has been noted. Past Medical History:  Diagnosis Date   Complication of anesthesia    Dysrhythmia    HX OF SVT   GERD (gastroesophageal reflux disease)    Graves disease    Headache    Hyperlipidemia    PONV (postoperative nausea and vomiting)    SVT (supraventricular tachycardia) (HCC)    Thyroid disease    Graves; now hypothyroid    Past Surgical History:  Procedure Laterality Date   ABDOMINAL HYSTERECTOMY     ABLATION OF DYSRHYTHMIC FOCUS  05/12/2014   DR Ladona Ridgel   BACK SURGERY     LASIK     SUPRAVENTRICULAR TACHYCARDIA ABLATION N/A 05/12/2014   Procedure: SUPRAVENTRICULAR TACHYCARDIA ABLATION;  Surgeon: Marinus Maw, MD;  Location: [facility] CATH LAB;  Service: Cardiovascular;  Laterality: N/A;   THYROIDECTOMY     TUBAL LIGATION      Family History  Problem Relation Age of Onset   Heart disease Father        Atrial fibrillation; ablation   Heart disease Paternal Aunt    Heart disease Paternal Uncle    Emphysema Paternal Grandmother        ? if smoked   Breast cancer Maternal Grandmother    Breast cancer Paternal Grandmother    Cervical cancer Other        ? Aunt or Grandmother    Current Outpatient Medications on File Prior to Visit  Medication Sig Dispense Refill   cyclobenzaprine (FLEXERIL) 10 MG tablet Take 10 mg by mouth 3 (three) times daily as needed for muscle spasms.     meclizine (ANTIVERT) 12.5 MG tablet Take 12.5 mg by mouth 3 (three) times daily as needed for dizziness.     SYNTHROID 125 MCG tablet Take 125 mcg by mouth daily before breakfast.      venlafaxine XR (EFFEXOR-XR) 75 MG 24 hr capsule  Take 75 mg by mouth daily with breakfast.     No current facility-administered medications on file prior to visit.    Allergies  Allergen Reactions   Dilaudid [Hydromorphone Hcl] Hives and Itching    Social History   Substance and Sexual Activity  Alcohol Use Yes   Alcohol/week: 0.0 standard drinks of alcohol   Comment: Occasional    Social History   Tobacco Use  Smoking Status Never  Smokeless Tobacco Never    Review of Systems  Constitutional: Negative.   HENT: Negative.    Eyes: Negative.   Respiratory: Negative.    Cardiovascular: Negative.   Gastrointestinal: Negative.   Genitourinary: Negative.   Musculoskeletal: Negative.   Skin: Negative.   Neurological: Negative.   Endo/Heme/Allergies: Negative.   Psychiatric/Behavioral: Negative.      Objective   Vitals:   12/18/23 1522  BP: 105/73  Pulse: 70  Resp: 14  Temp: 98.1 F (36.7 C)  SpO2: 94%    Physical Exam Vitals reviewed.  Constitutional:      Appearance: Normal appearance. She is normal weight. She is not ill-appearing.  HENT:     Head: Normocephalic and atraumatic.  Cardiovascular:     Rate and Rhythm: Normal rate and  regular rhythm.     Heart sounds: Normal heart sounds. No murmur heard.    No friction rub. No gallop.  Pulmonary:     Effort: Pulmonary effort is normal. No respiratory distress.     Breath sounds: Normal breath sounds. No stridor. No wheezing, rhonchi or rales.  Chest:     Chest wall: No tenderness.  Abdominal:     General: Bowel sounds are normal. There is no distension.     Palpations: Abdomen is soft. There is no mass.     Tenderness: There is no abdominal tenderness. There is no guarding.     Hernia: No hernia is present.  Skin:    General: Skin is warm and dry.  Neurological:     Mental Status: She is alert and oriented to person, place, and time.     Assessment  Need for screening colonoscopy Plan  Patient will call to schedule her colonoscopy.  The  risks and benefits of the procedure including bleeding, infection, and perforation were fully explained to the patient, who gave informed consent.  Sutabs have been prescribed for bowel preparation.

## 2023-12-29 NOTE — H&P (Signed)
 Melissa Levy; [id number]; [birth year]   HPI Patient is a 58 year old white female who was referred to my care by [name] for evaluation and treatment of a screening colonoscopy.  Patient has never had a colonoscopy.  She denies any family history of colon cancer.  She denies any abnormal abdominal pain, diarrhea, constipation, or weight change.  No blood per rectum has been noted. Past Medical History:  Diagnosis Date   Complication of anesthesia    Dysrhythmia    HX OF SVT   GERD (gastroesophageal reflux disease)    Graves disease    Headache    Hyperlipidemia    PONV (postoperative nausea and vomiting)    SVT (supraventricular tachycardia) (HCC)    Thyroid disease    Graves; now hypothyroid    Past Surgical History:  Procedure Laterality Date   ABDOMINAL HYSTERECTOMY     ABLATION OF DYSRHYTHMIC FOCUS  05/12/2014   DR Ladona Ridgel   BACK SURGERY     LASIK     SUPRAVENTRICULAR TACHYCARDIA ABLATION N/A 05/12/2014   Procedure: SUPRAVENTRICULAR TACHYCARDIA ABLATION;  Surgeon: Marinus Maw, MD;  Location: [facility] CATH LAB;  Service: Cardiovascular;  Laterality: N/A;   THYROIDECTOMY     TUBAL LIGATION      Family History  Problem Relation Age of Onset   Heart disease Father        Atrial fibrillation; ablation   Heart disease Paternal Aunt    Heart disease Paternal Uncle    Emphysema Paternal Grandmother        ? if smoked   Breast cancer Maternal Grandmother    Breast cancer Paternal Grandmother    Cervical cancer Other        ? Aunt or Grandmother    Current Outpatient Medications on File Prior to Visit  Medication Sig Dispense Refill   cyclobenzaprine (FLEXERIL) 10 MG tablet Take 10 mg by mouth 3 (three) times daily as needed for muscle spasms.     meclizine (ANTIVERT) 12.5 MG tablet Take 12.5 mg by mouth 3 (three) times daily as needed for dizziness.     SYNTHROID 125 MCG tablet Take 125 mcg by mouth daily before breakfast.      venlafaxine XR (EFFEXOR-XR) 75 MG 24 hr capsule  Take 75 mg by mouth daily with breakfast.     No current facility-administered medications on file prior to visit.    Allergies  Allergen Reactions   Dilaudid [Hydromorphone Hcl] Hives and Itching    Social History   Substance and Sexual Activity  Alcohol Use Yes   Alcohol/week: 0.0 standard drinks of alcohol   Comment: Occasional    Social History   Tobacco Use  Smoking Status Never  Smokeless Tobacco Never    Review of Systems  Constitutional: Negative.   HENT: Negative.    Eyes: Negative.   Respiratory: Negative.    Cardiovascular: Negative.   Gastrointestinal: Negative.   Genitourinary: Negative.   Musculoskeletal: Negative.   Skin: Negative.   Neurological: Negative.   Endo/Heme/Allergies: Negative.   Psychiatric/Behavioral: Negative.      Objective   Vitals:   12/18/23 1522  BP: 105/73  Pulse: 70  Resp: 14  Temp: 98.1 F (36.7 C)  SpO2: 94%    Physical Exam Vitals reviewed.  Constitutional:      Appearance: Normal appearance. She is normal weight. She is not ill-appearing.  HENT:     Head: Normocephalic and atraumatic.  Cardiovascular:     Rate and Rhythm: Normal rate and  regular rhythm.     Heart sounds: Normal heart sounds. No murmur heard.    No friction rub. No gallop.  Pulmonary:     Effort: Pulmonary effort is normal. No respiratory distress.     Breath sounds: Normal breath sounds. No stridor. No wheezing, rhonchi or rales.  Chest:     Chest wall: No tenderness.  Abdominal:     General: Bowel sounds are normal. There is no distension.     Palpations: Abdomen is soft. There is no mass.     Tenderness: There is no abdominal tenderness. There is no guarding.     Hernia: No hernia is present.  Skin:    General: Skin is warm and dry.  Neurological:     Mental Status: She is alert and oriented to person, place, and time.     Assessment  Need for screening colonoscopy Plan  Patient will call to schedule her colonoscopy.  The  risks and benefits of the procedure including bleeding, infection, and perforation were fully explained to the patient, who gave informed consent.  Sutabs have been prescribed for bowel preparation.

## 2024-01-22 ENCOUNTER — Encounter (HOSPITAL_COMMUNITY): Payer: Self-pay | Admitting: General Surgery

## 2024-01-22 ENCOUNTER — Other Ambulatory Visit: Payer: Self-pay

## 2024-01-22 ENCOUNTER — Encounter (HOSPITAL_COMMUNITY)
Admission: RE | Disposition: A | Discharge status: Home / Self Care | Payer: Self-pay | Source: Home / Self Care | Attending: General Surgery

## 2024-01-22 ENCOUNTER — Ambulatory Visit (HOSPITAL_COMMUNITY)
Admission: RE | Admit: 2024-01-22 | Discharge: 2024-01-22 | Disposition: A | Discharge status: Home / Self Care | Attending: General Surgery | Admitting: General Surgery

## 2024-01-22 ENCOUNTER — Ambulatory Visit (HOSPITAL_COMMUNITY): Admitting: Certified Registered Nurse Anesthetist

## 2024-01-22 DIAGNOSIS — Z1211 Encounter for screening for malignant neoplasm of colon: Secondary | ICD-10-CM | POA: Diagnosis not present

## 2024-01-22 DIAGNOSIS — I499 Cardiac arrhythmia, unspecified: Secondary | ICD-10-CM | POA: Diagnosis not present

## 2024-01-22 DIAGNOSIS — K219 Gastro-esophageal reflux disease without esophagitis: Secondary | ICD-10-CM | POA: Insufficient documentation

## 2024-01-22 DIAGNOSIS — G4730 Sleep apnea, unspecified: Secondary | ICD-10-CM | POA: Insufficient documentation

## 2024-01-22 DIAGNOSIS — I10 Essential (primary) hypertension: Secondary | ICD-10-CM | POA: Insufficient documentation

## 2024-01-22 HISTORY — PX: COLONOSCOPY: SHX5424

## 2024-01-22 SURGERY — COLONOSCOPY
Anesthesia: General

## 2024-01-22 MED ORDER — CHLORHEXIDINE GLUCONATE CLOTH 2 % EX PADS: 6.0000 | MEDICATED_PAD | Freq: Once | CUTANEOUS | Status: DC

## 2024-01-22 MED ORDER — LACTATED RINGERS IV SOLN: INTRAVENOUS | Status: DC | PRN

## 2024-01-22 MED ORDER — LACTATED RINGERS IV SOLN: INTRAVENOUS | Status: DC

## 2024-01-22 MED ORDER — PROPOFOL 500 MG/50ML IV EMUL
INTRAVENOUS | Status: DC | PRN
  Administered 2024-01-22: 125 ug/kg/min via INTRAVENOUS
  Administered 2024-01-22: 150 ug/kg/min via INTRAVENOUS
  Administered 2024-01-22: 100 mg via INTRAVENOUS

## 2024-01-22 NOTE — Transfer of Care (Signed)
 Immediate Anesthesia Transfer of Care Note  Patient: Melissa Levy  Procedure(s) Performed: COLONOSCOPY  Patient Location: Endoscopy Unit  Anesthesia Type:General  Level of Consciousness: awake, alert , and oriented  Airway & Oxygen Therapy: Patient Spontanous Breathing  Post-op Assessment: Report given to RN and Post -op Vital signs reviewed and stable  Post vital signs: Reviewed and stable  Last Vitals:  Vitals Value Taken Time  BP 101/67 01/22/24 0731  Temp 36.6 C 01/22/24 0731  Pulse 68 01/22/24 0731  Resp 17 01/22/24 0731  SpO2 96 % 01/22/24 0731    Last Pain:  Vitals:   01/22/24 0731  TempSrc: Oral  PainSc: 0-No pain      Patients Stated Pain Goal: 6 (01/22/24 0656)  Complications: No notable events documented.

## 2024-01-22 NOTE — Interval H&P Note (Signed)
 History and Physical Interval Note:  01/22/2024 7:13 AM  Melissa Levy  has presented today for surgery, with the diagnosis of SCREENING FOR COLON CANCER.  The various methods of treatment have been discussed with the patient and family. After consideration of risks, benefits and other options for treatment, the patient has consented to  Procedure(s) with comments: COLONOSCOPY (N/A) - W/ PROPOFOL  as a surgical intervention.  The patient's history has been reviewed, patient examined, no change in status, stable for surgery.  I have reviewed the patient's chart and labs.  Questions were answered to the patient's satisfaction.     Alanda Allegra

## 2024-01-22 NOTE — Anesthesia Preprocedure Evaluation (Signed)
 Anesthesia Evaluation  Patient identified by MRN, date of birth, ID band Patient awake    Reviewed: Allergy & Precautions, H&P , NPO status , Patient's Chart, lab work & pertinent test results, reviewed documented beta blocker date and time   History of Anesthesia Complications (+) PONV and history of anesthetic complications  Airway Mallampati: II  TM Distance: >3 FB Neck ROM: full    Dental no notable dental hx.    Pulmonary shortness of breath, sleep apnea    Pulmonary exam normal breath sounds clear to auscultation       Cardiovascular Exercise Tolerance: Good hypertension, + dysrhythmias  Rhythm:regular Rate:Normal     Neuro/Psych  Headaches  negative psych ROS   GI/Hepatic Neg liver ROS,GERD  ,,  Endo/Other  negative endocrine ROS    Renal/GU negative Renal ROS  negative genitourinary   Musculoskeletal   Abdominal   Peds  Hematology negative hematology ROS (+)   Anesthesia Other Findings   Reproductive/Obstetrics negative OB ROS                             Anesthesia Physical Anesthesia Plan  ASA: 2  Anesthesia Plan: General   Post-op Pain Management:    Induction:   PONV Risk Score and Plan: Propofol  infusion  Airway Management Planned:   Additional Equipment:   Intra-op Plan:   Post-operative Plan:   Informed Consent: I have reviewed the patients History and Physical, chart, labs and discussed the procedure including the risks, benefits and alternatives for the proposed anesthesia with the patient or authorized representative who has indicated his/her understanding and acceptance.     Dental Advisory Given  Plan Discussed with: CRNA  Anesthesia Plan Comments:        Anesthesia Quick Evaluation

## 2024-01-22 NOTE — Op Note (Signed)
 [facility] Patient Name: Melissa Levy Procedure Date: 01/22/2024 7:05 AM MRN: [id number] Date of Birth: 1966-03-29 Attending MD: Alanda Allegra , MD, [id number] CSN: [id number] Age: 58 Admit Type: Outpatient Procedure:                Colonoscopy Indications:              Screening for colorectal malignant neoplasm Providers:                Alanda Allegra, MD, Willena Harp, [place] Wilson,                            Technician, Theola Fitch Referring MD:              Medicines:                Propofol  per Anesthesia Complications:            No immediate complications. Estimated Blood Loss:     Estimated blood loss: none. Procedure:                Pre-Anesthesia Assessment:                           - Prior to the procedure, a History and Physical                            was performed, and patient medications and                            allergies were reviewed. The patient is competent.                            The risks and benefits of the procedure and the                            sedation options and risks were discussed with the                            patient. All questions were answered and informed                            consent was obtained. Patient identification and                            proposed procedure were verified by the physician,                            the nurse, the anesthetist and the technician in                            the procedure room. Mental Status Examination:                            alert and oriented. Airway Examination: normal  oropharyngeal airway and neck mobility. Respiratory                            Examination: clear to auscultation. CV Examination:                            RRR, no murmurs, no S3 or S4. Prophylactic                            Antibiotics: The patient does not require                            prophylactic antibiotics. Prior Anticoagulants: The                             patient has taken no anticoagulant or antiplatelet                            agents. ASA Grade Assessment: II - A patient with                            mild systemic disease. After reviewing the risks                            and benefits, the patient was deemed in                            satisfactory condition to undergo the procedure.                            The anesthesia plan was to use deep sedation /                            analgesia. Immediately prior to administration of                            medications, the patient was re-assessed for                            adequacy to receive sedatives. The heart rate,                            respiratory rate, oxygen saturations, blood                            pressure, adequacy of pulmonary ventilation, and                            response to care were monitored throughout the                            procedure. The physical status of the patient was  re-assessed after the procedure.                           After obtaining informed consent, the colonoscope                            was passed under direct vision. Throughout the                            procedure, the patient's blood pressure, pulse, and                            oxygen saturations were monitored continuously. The                            [id number]) scope was introduced through the                            anus and advanced to the the cecum, identified by                            the appendiceal orifice, ileocecal valve and                            palpation. The terminal ileum was photographed. The                            entire colon was well visualized. The colonoscopy                            was performed without difficulty. The patient                            tolerated the procedure well. The quality of the                            bowel preparation was adequate. The total duration                             of the procedure was 9 minutes. Scope In: 7:19:05 AM Scope Out: 7:28:37 AM Scope Withdrawal Time: 0 hours 4 minutes 36 seconds  Total Procedure Duration: 0 hours 9 minutes 32 seconds  Findings:      The perianal and digital rectal examinations were normal.      The entire examined colon appeared normal on direct and retroflexion       views. Impression:               - The entire examined colon is normal on direct and                            retroflexion views.                           - No specimens collected. Moderate Sedation:      Moderate (conscious)  sedation was administered by the nurse and       supervised by the endoscopist. The patient's oxygen saturation, heart       rate, blood pressure and response to care were monitored. Recommendation:           - Written discharge instructions were provided to                            the patient.                           - The signs and symptoms of potential delayed                            complications were discussed with the patient.                           - Patient has a contact number available for                            emergencies.                           - Return to normal activities tomorrow.                           - Resume previous diet.                           - Continue present medications.                           - Repeat colonoscopy in 10 years for screening                            purposes. Procedure Code(s):        --- Professional ---                           [phone], Colonoscopy, flexible; diagnostic, including                            collection of specimen(s) by brushing or washing,                            when performed (separate procedure) Diagnosis Code(s):        --- Professional ---                           Z12.11, Encounter for screening for malignant                            neoplasm of colon CPT copyright 2022 [employer]. All  rights reserved. The codes documented in this report are preliminary and upon coder review may  be revised to meet current compliance requirements. Alanda Allegra, MD Alanda Allegra, MD 01/22/2024 8:18:43 AM This report has been signed electronically. Number of Addenda: 0

## 2024-01-23 ENCOUNTER — Encounter (HOSPITAL_COMMUNITY): Payer: Self-pay | Admitting: General Surgery

## 2024-01-23 NOTE — Anesthesia Postprocedure Evaluation (Signed)
 Anesthesia Post Note  Patient: Melissa Levy  Procedure(s) Performed: COLONOSCOPY  Patient location during evaluation: Phase II Anesthesia Type: General Level of consciousness: awake Pain management: pain level controlled Vital Signs Assessment: post-procedure vital signs reviewed and stable Respiratory status: spontaneous breathing and respiratory function stable Cardiovascular status: blood pressure returned to baseline and stable Postop Assessment: no headache and no apparent nausea or vomiting Anesthetic complications: no Comments: Late entry   No notable events documented.   Last Vitals:  Vitals:   01/22/24 0656 01/22/24 0731  BP: 116/75 101/67  Pulse: 70 68  Resp: 18 17  Temp: 36.7 C 36.6 C  SpO2: 96% 96%    Last Pain:  Vitals:   01/22/24 0731  TempSrc: Oral  PainSc: 0-No pain                 Coretha Dew

## 2024-02-19 ENCOUNTER — Ambulatory Visit
Admission: RE | Admit: 2024-02-19 | Discharge: 2024-02-19 | Disposition: A | Discharge status: Home / Self Care | Source: Ambulatory Visit

## 2024-02-19 ENCOUNTER — Telehealth: Payer: Self-pay | Admitting: Emergency Medicine

## 2024-02-19 VITALS — BP 145/87 | HR 81 | Temp 99.0°F | Resp 18

## 2024-02-19 DIAGNOSIS — J209 Acute bronchitis, unspecified: Secondary | ICD-10-CM

## 2024-02-19 LAB — POC SARS CORONAVIRUS 2 AG -  ED: SARS Coronavirus 2 Ag: NEGATIVE

## 2024-02-19 MED ORDER — PROMETHAZINE-DM 6.25-15 MG/5ML PO SYRP: 5.0000 mL | ORAL_SOLUTION | Freq: Four times a day (QID) | ORAL | 0 refills | Status: AC | PRN

## 2024-02-19 MED ORDER — PROMETHAZINE-DM 6.25-15 MG/5ML PO SYRP: 5.0000 mL | ORAL_SOLUTION | Freq: Four times a day (QID) | ORAL | 0 refills | Status: DC | PRN

## 2024-02-19 MED ORDER — PREDNISONE 20 MG PO TABS: 40.0000 mg | ORAL_TABLET | Freq: Every day | ORAL | 0 refills | Status: DC

## 2024-02-19 MED ORDER — PREDNISONE 20 MG PO TABS: 40.0000 mg | ORAL_TABLET | Freq: Every day | ORAL | 0 refills | Status: AC

## 2024-02-19 MED ORDER — METHYLPREDNISOLONE SODIUM SUCC 125 MG IJ SOLR
125.0000 mg | Freq: Once | INTRAMUSCULAR | Status: AC
  Administered 2024-02-19: 125 mg via INTRAMUSCULAR

## 2024-02-19 MED ORDER — ALBUTEROL SULFATE HFA 108 (90 BASE) MCG/ACT IN AERS: 2.0000 | INHALATION_SPRAY | Freq: Four times a day (QID) | RESPIRATORY_TRACT | 0 refills | Status: AC | PRN

## 2024-02-19 MED ORDER — ALBUTEROL SULFATE HFA 108 (90 BASE) MCG/ACT IN AERS: 2.0000 | INHALATION_SPRAY | Freq: Four times a day (QID) | RESPIRATORY_TRACT | 0 refills | Status: DC | PRN

## 2024-02-19 NOTE — ED Provider Notes (Signed)
 [facility]    CSN: [id number] Arrival date & time: 02/19/24  1018      History   Chief Complaint Chief Complaint  Patient presents with   Cough    Severe cough and congestion.  Had my boss listen to my chest and he heard something going on in my chest.  Low grade fever overnight - Entered by patient    HPI Melissa Levy is a 58 y.o. female.   The history is provided by the patient.   Patient presents with a 3-day history of cough, nasal congestion, chest pressure, and low-grade fever.  Symptoms worsened over the past 24 hours per the patient's report.  States that she had a fever last evening around 100.  She denies ear pain, headache, runny nose, wheezing, difficulty breathing, abdominal pain, nausea, vomiting, diarrhea, or rash.  States that she has been taking over-the-counter cough and cold medications such as Mucinex and using Zicam nasal spray.  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  Past Medical History:  Diagnosis Date   Complication of anesthesia    Dysrhythmia    HX OF SVT   GERD (gastroesophageal reflux disease)    Graves disease    Headache    Hyperlipidemia    PONV (postoperative nausea and vomiting)    SVT (supraventricular tachycardia) (HCC)    Thyroid  disease    Graves; now hypothyroid    Patient Active Problem List   Diagnosis Date Noted   Special screening for malignant neoplasms, colon 01/22/2024   Dyspnea 10/28/2015   Multiple pulmonary nodules determined by computed tomography of lung 10/28/2015   OSA (obstructive sleep apnea) 06/12/2014   Paroxysmal supraventricular tachycardia (HCC) 05/12/2014   SVT (supraventricular tachycardia) (HCC) 04/22/2014   Chest pain 04/22/2014   Elevated glucose 04/22/2014   Elevated white blood cell count 04/22/2014    Past Surgical History:  Procedure Laterality Date   ABDOMINAL HYSTERECTOMY     ABLATION OF DYSRHYTHMIC FOCUS  05/12/2014   DR Carolynne Citron   BACK SURGERY     COLONOSCOPY  N/A 01/22/2024   Procedure: COLONOSCOPY;  Surgeon: Alanda Allegra, MD;  Location: [facility];  Service: Gastroenterology;  Laterality: N/A;  W/ PROPOFOL    LASIK     SUPRAVENTRICULAR TACHYCARDIA ABLATION N/A 05/12/2014   Procedure: SUPRAVENTRICULAR TACHYCARDIA ABLATION;  Surgeon: Tammie Fall, MD;  Location: [facility] CATH LAB;  Service: Cardiovascular;  Laterality: N/A;   THYROIDECTOMY     TUBAL LIGATION      OB History   No obstetric history on file.      Home Medications    Prior to Admission medications   Medication Sig Start Date End Date Taking? Authorizing Provider  omeprazole (PRILOSEC) 40 MG capsule Take 40 mg by mouth daily. 02/03/11  Yes [provider]  albuterol  (VENTOLIN  HFA) 108 (90 Base) MCG/ACT inhaler Inhale 2 puffs into the lungs every 6 (six) hours as needed. 02/19/24   Leath-Warren, Belen Bowers, NP  cyclobenzaprine (FLEXERIL) 10 MG tablet Take 10 mg by mouth 3 (three) times daily as needed for muscle spasms.    [provider]  meclizine (ANTIVERT) 12.5 MG tablet Take 12.5 mg by mouth 3 (three) times daily as needed for dizziness.    [provider]  predniSONE (DELTASONE) 20 MG tablet Take 2 tablets (40 mg total) by mouth daily with breakfast for 5 days. 02/19/24 02/24/24  Leath-Warren, Belen Bowers, NP  promethazine-dextromethorphan (PROMETHAZINE-DM) 6.25-15 MG/5ML syrup Take 5  mLs by mouth 4 (four) times daily as needed. 02/19/24   Leath-Warren, Belen Bowers, NP  SYNTHROID  125 MCG tablet Take 125 mcg by mouth daily before breakfast.  04/16/14   [provider]  venlafaxine  XR (EFFEXOR -XR) 75 MG 24 hr capsule Take 75 mg by mouth daily with breakfast.    [provider]    Family History Family History  Problem Relation Age of Onset   Heart disease Father        Atrial fibrillation; ablation   Heart disease Paternal Aunt    Heart disease Paternal Uncle    Emphysema Paternal Grandmother        ? if smoked   Breast cancer Maternal  Grandmother    Breast cancer Paternal Grandmother    Cervical cancer Other        ? Aunt or Grandmother    Social History Social History   Tobacco Use   Smoking status: Never   Smokeless tobacco: Never  Substance Use Topics   Alcohol use: Yes    Alcohol/week: 0.0 standard drinks of alcohol    Comment: Occasional   Drug use: No     Allergies   Dilaudid [hydromorphone hcl]   Review of Systems Review of Systems Per HPI  Physical Exam Triage Vital Signs ED Triage Vitals  Encounter Vitals Group     BP 02/19/24 1025 (!) 145/87     Systolic BP Percentile --      Diastolic BP Percentile --      Pulse Rate 02/19/24 1025 81     Resp 02/19/24 1025 18     Temp 02/19/24 1025 99 F (37.2 C)     Temp Source 02/19/24 1025 Oral     SpO2 02/19/24 1025 97 %     Weight --      Height --      Head Circumference --      Peak Flow --      Pain Score 02/19/24 1027 0     Pain Loc --      Pain Education --      Exclude from Growth Chart --    No data found.  Updated Vital Signs BP (!) 145/87 (BP Location: Right Arm)   Pulse 81   Temp 99 F (37.2 C) (Oral)   Resp 18   SpO2 97%   Visual Acuity Right Eye Distance:   Left Eye Distance:   Bilateral Distance:    Right Eye Near:   Left Eye Near:    Bilateral Near:     Physical Exam Vitals and nursing note reviewed.  Constitutional:      General: She is not in acute distress.    Appearance: Normal appearance.  HENT:     Head: Normocephalic.     Right Ear: Tympanic membrane, ear canal and external ear normal.     Left Ear: Tympanic membrane, ear canal and external ear normal.     Nose: Congestion present.     Right Turbinates: Enlarged and swollen.     Left Turbinates: Enlarged and swollen.     Right Sinus: No maxillary sinus tenderness or frontal sinus tenderness.     Left Sinus: No maxillary sinus tenderness or frontal sinus tenderness.     Mouth/Throat:     Lips: Pink.     Mouth: Mucous membranes are moist.      Pharynx: Uvula midline. Postnasal drip present. No pharyngeal swelling, oropharyngeal exudate, posterior oropharyngeal erythema or uvula swelling.  Comments: Cobblestoning present to posterior oropharynx  Eyes:     Extraocular Movements: Extraocular movements intact.     Conjunctiva/sclera: Conjunctivae normal.     Pupils: Pupils are equal, round, and reactive to light.  Cardiovascular:     Rate and Rhythm: Normal rate and regular rhythm.     Pulses: Normal pulses.     Heart sounds: Normal heart sounds.  Pulmonary:     Effort: Pulmonary effort is normal. No respiratory distress.     Breath sounds: No stridor. Rhonchi (Posterior left lower lobe, clears with cough) present. No wheezing or rales.  Abdominal:     General: Bowel sounds are normal.     Palpations: Abdomen is soft.     Tenderness: There is no abdominal tenderness.  Musculoskeletal:     Cervical back: Normal range of motion.  Lymphadenopathy:     Cervical: No cervical adenopathy.  Skin:    General: Skin is warm and dry.  Neurological:     General: No focal deficit present.     Mental Status: She is alert and oriented to person, place, and time.  Psychiatric:        Mood and Affect: Mood normal.        Behavior: Behavior normal.      UC Treatments / Results  Labs (all labs ordered are listed, but only abnormal results are displayed) Labs Reviewed  POC SARS CORONAVIRUS 2 AG -  ED    EKG   Radiology No results found.  Procedures Procedures (including critical care time)  Medications Ordered in UC Medications  methylPREDNISolone  sodium succinate (SOLU-MEDROL ) 125 mg/2 mL injection 125 mg (125 mg Intramuscular Given 02/19/24 1100)    Initial Impression / Assessment and Plan / UC Course  I have reviewed the triage vital signs and the nursing notes.  Pertinent labs & imaging results that were available during my care of the patient were reviewed by me and considered in my medical decision making (see chart  for details).  The COVID test was negative.  On exam, patient did have rhonchi in the posterior left lower lobe that cleared with cough, symptoms consistent with acute bronchitis.  Solu-Medrol  125 mg IM administered.  Will treat with prednisone 40 mg for the next 5 days, and albuterol  inhaler for chest heaviness, and Promethazine DM for the cough.  Supportive care recommendations were provided and discussed with the patient to include fluids, rest, over-the-counter analgesics, and use of a humidifier during sleep.  Discussed indications with patient regarding follow-up.  Patient was in agreement with this plan of care and verbalizes understanding.  All questions were answered.  Patient stable for discharge.  Work note was provided.  Final Clinical Impressions(s) / UC Diagnoses   Final diagnoses:  Acute bronchitis, unspecified organism     Discharge Instructions      The COVID test was negative. Take medication as prescribed. Increase fluids and allow for plenty of rest. You may take over-the-counter Tylenol  or ibuprofen  as needed for pain, fever, or general discomfort. May use normal saline nasal spray throughout the day for nasal congestion and runny nose. For the cough, recommend use of a humidifier in your bedroom at nighttime during sleep and sleeping elevated on pillows while symptoms persist. Please be advised that your cough may last from days to weeks.  If you are generally feeling well and have a persistent nagging cough, continue over-the-counter cough medications, cough drops, and increasing your fluid intake.  If you develop wheezing, difficulty breathing,  or other concerns, seek care immediately. Follow-up with as needed.   ED Prescriptions     Medication Sig Dispense Auth. Provider   predniSONE (DELTASONE) 20 MG tablet Take 2 tablets (40 mg total) by mouth daily with breakfast for 5 days. 10 tablet Leath-Warren, Belen Bowers, NP   promethazine-dextromethorphan  (PROMETHAZINE-DM) 6.25-15 MG/5ML syrup Take 5 mLs by mouth 4 (four) times daily as needed. 118 mL Leath-Warren, Belen Bowers, NP   albuterol  (VENTOLIN  HFA) 108 (90 Base) MCG/ACT inhaler Inhale 2 puffs into the lungs every 6 (six) hours as needed. 8 g Leath-Warren, Belen Bowers, NP      PDMP not reviewed this encounter.   Hardy Lia, NP 02/19/24 [phone]

## 2024-02-19 NOTE — Telephone Encounter (Signed)
 Patient requested prescriptions be sent to [facility] instead of [facility]

## 2024-02-19 NOTE — ED Triage Notes (Addendum)
 Pt reports cough, congestion, pressure in the chest, dizziness, low grade fever x 3 days. Tried OTC Mucinex and Zicam

## 2024-02-19 NOTE — Discharge Instructions (Signed)
 The COVID test was negative. Take medication as prescribed. Increase fluids and allow for plenty of rest. You may take over-the-counter Tylenol  or ibuprofen  as needed for pain, fever, or general discomfort. May use normal saline nasal spray throughout the day for nasal congestion and runny nose. For the cough, recommend use of a humidifier in your bedroom at nighttime during sleep and sleeping elevated on pillows while symptoms persist. Please be advised that your cough may last from days to weeks.  If you are generally feeling well and have a persistent nagging cough, continue over-the-counter cough medications, cough drops, and increasing your fluid intake.  If you develop wheezing, difficulty breathing, or other concerns, seek care immediately. Follow-up with as needed.

## 2024-02-21 ENCOUNTER — Other Ambulatory Visit: Payer: Self-pay

## 2024-02-21 ENCOUNTER — Encounter (HOSPITAL_COMMUNITY): Payer: Self-pay

## 2024-02-21 ENCOUNTER — Emergency Department (HOSPITAL_COMMUNITY)
Admission: EM | Admit: 2024-02-21 | Discharge: 2024-02-21 | Disposition: A | Discharge status: Home / Self Care | Attending: Student | Admitting: Student

## 2024-02-21 ENCOUNTER — Telehealth: Payer: Self-pay

## 2024-02-21 ENCOUNTER — Emergency Department (HOSPITAL_COMMUNITY)

## 2024-02-21 DIAGNOSIS — E039 Hypothyroidism, unspecified: Secondary | ICD-10-CM | POA: Diagnosis not present

## 2024-02-21 DIAGNOSIS — J40 Bronchitis, not specified as acute or chronic: Secondary | ICD-10-CM | POA: Insufficient documentation

## 2024-02-21 DIAGNOSIS — I10 Essential (primary) hypertension: Secondary | ICD-10-CM | POA: Diagnosis not present

## 2024-02-21 DIAGNOSIS — R0789 Other chest pain: Secondary | ICD-10-CM | POA: Diagnosis present

## 2024-02-21 DIAGNOSIS — R059 Cough, unspecified: Secondary | ICD-10-CM | POA: Diagnosis not present

## 2024-02-21 DIAGNOSIS — Z79899 Other long term (current) drug therapy: Secondary | ICD-10-CM | POA: Insufficient documentation

## 2024-02-21 LAB — CBC
HCT: 42.3 % (ref 36.0–46.0)
Hemoglobin: 14.2 g/dL (ref 12.0–15.0)
MCH: 28.6 pg (ref 26.0–34.0)
MCHC: 33.6 g/dL (ref 30.0–36.0)
MCV: 85.1 fL (ref 80.0–100.0)
Platelets: 235 10*3/uL (ref 150–400)
RBC: 4.97 MIL/uL (ref 3.87–5.11)
RDW: 11.8 % (ref 11.5–15.5)
WBC: 10 10*3/uL (ref 4.0–10.5)
nRBC: 0 % (ref 0.0–0.2)

## 2024-02-21 LAB — RESP PANEL BY RT-PCR (RSV, FLU A&B, COVID)  RVPGX2
Influenza A by PCR: NEGATIVE
Influenza B by PCR: NEGATIVE
Resp Syncytial Virus by PCR: NEGATIVE
SARS Coronavirus 2 by RT PCR: NEGATIVE

## 2024-02-21 LAB — BASIC METABOLIC PANEL WITH GFR
Anion gap: 10 (ref 5–15)
BUN: 18 mg/dL (ref 6–20)
CO2: 24 mmol/L (ref 22–32)
Calcium: 9.2 mg/dL (ref 8.9–10.3)
Chloride: 102 mmol/L (ref 98–111)
Creatinine, Ser: 0.63 mg/dL (ref 0.44–1.00)
GFR, Estimated: >60 mL/min (ref >60)
Glucose, Bld: 147 mg/dL — ABNORMAL HIGH (ref 70–99)
Potassium: 3.8 mmol/L (ref 3.5–5.1)
Sodium: 136 mmol/L (ref 135–145)

## 2024-02-21 LAB — TROPONIN I (HIGH SENSITIVITY): Troponin I (High Sensitivity): <2 ng/L (ref <18)

## 2024-02-21 MED ORDER — GUAIFENESIN 100 MG/5ML PO LIQD
15.0000 mL | Freq: Once | ORAL | Status: AC
  Administered 2024-02-21: 15 mL via ORAL
  Filled 2024-02-21: qty 15

## 2024-02-21 MED ORDER — IPRATROPIUM-ALBUTEROL 0.5-2.5 (3) MG/3ML IN SOLN
RESPIRATORY_TRACT | Status: AC
  Filled 2024-02-21: qty 3

## 2024-02-21 MED ORDER — BENZONATATE 100 MG PO CAPS
200.0000 mg | ORAL_CAPSULE | Freq: Once | ORAL | Status: AC
  Administered 2024-02-21: 200 mg via ORAL
  Filled 2024-02-21: qty 2

## 2024-02-21 MED ORDER — IPRATROPIUM-ALBUTEROL 0.5-2.5 (3) MG/3ML IN SOLN
3.0000 mL | Freq: Once | RESPIRATORY_TRACT | Status: AC
  Administered 2024-02-21: 3 mL via RESPIRATORY_TRACT
  Filled 2024-02-21: qty 3

## 2024-02-21 MED ORDER — BENZONATATE 100 MG PO CAPS: 100.0000 mg | ORAL_CAPSULE | Freq: Three times a day (TID) | ORAL | 0 refills | Status: AC

## 2024-02-21 MED ORDER — IPRATROPIUM-ALBUTEROL 0.5-2.5 (3) MG/3ML IN SOLN
6.0000 mL | Freq: Once | RESPIRATORY_TRACT | Status: AC
  Administered 2024-02-21: 6 mL via RESPIRATORY_TRACT
  Filled 2024-02-21: qty 3

## 2024-02-21 MED ORDER — GUAIFENESIN ER 600 MG PO TB12: 600.0000 mg | ORAL_TABLET | Freq: Two times a day (BID) | ORAL | 0 refills | Status: AC

## 2024-02-21 NOTE — ED Triage Notes (Signed)
 Pt reports:  SOB Worsening since tues Seen at [facility]  Given ABX Dx with bronchiolitis  CP Worsening since tues

## 2024-02-21 NOTE — ED Notes (Signed)
 Patient transported to XR.

## 2024-02-21 NOTE — ED Notes (Signed)
 Patient transported to X-ray

## 2024-02-21 NOTE — ED Notes (Signed)
 RT aware of tx needed.

## 2024-02-21 NOTE — ED Provider Notes (Signed)
 [place] EMERGENCY DEPARTMENT AT [facility] Provider Note  CSN: [id number] Arrival date & time: 02/21/24 1628  Chief Complaint(s) Shortness of Breath and Chest Pain  HPI AZLIN ZILBERMAN is a 58 y.o. female with PMH SVT status post ablation, HTN, HLD, Graves' disease who presents emergency room for evaluation of cough and chest tightness.  Was seen on 02/19/2024 in urgent care and diagnosed with bronchitis.  Ultimately discharged after receiving 125 of Solu-Medrol  intramuscularly and placed on prednisone.  Symptoms not improving at home and came to the emergency room for further evaluation.  Here in the ER, she is endorsing chest tightness and exertional dyspnea but denies abdominal pain, nausea, vomiting, headache, fever or other systemic symptoms.   Past Medical History Past Medical History:  Diagnosis Date   Complication of anesthesia    Dysrhythmia    HX OF SVT   GERD (gastroesophageal reflux disease)    Graves disease    Headache    Hyperlipidemia    PONV (postoperative nausea and vomiting)    SVT (supraventricular tachycardia) (HCC)    Thyroid  disease    Graves; now hypothyroid   Patient Active Problem List   Diagnosis Date Noted   Special screening for malignant neoplasms, colon 01/22/2024   Dyspnea 10/28/2015   Multiple pulmonary nodules determined by computed tomography of lung 10/28/2015   OSA (obstructive sleep apnea) 06/12/2014   Paroxysmal supraventricular tachycardia (HCC) 05/12/2014   SVT (supraventricular tachycardia) (HCC) 04/22/2014   Chest pain 04/22/2014   Elevated glucose 04/22/2014   Elevated white blood cell count 04/22/2014   Home Medication(s) Prior to Admission medications   Medication Sig Start Date End Date Taking? Authorizing Provider  albuterol  (VENTOLIN  HFA) 108 (90 Base) MCG/ACT inhaler Inhale 2 puffs into the lungs every 6 (six) hours as needed. 02/19/24   Leath-Warren, Belen Bowers, NP  cyclobenzaprine (FLEXERIL) 10 MG tablet Take 10 mg  by mouth 3 (three) times daily as needed for muscle spasms.    [provider]  meclizine (ANTIVERT) 12.5 MG tablet Take 12.5 mg by mouth 3 (three) times daily as needed for dizziness.    [provider]  omeprazole (PRILOSEC) 40 MG capsule Take 40 mg by mouth daily. 02/03/11   [provider]  predniSONE (DELTASONE) 20 MG tablet Take 2 tablets (40 mg total) by mouth daily with breakfast for 5 days. 02/19/24 02/24/24  Leath-Warren, Belen Bowers, NP  promethazine-dextromethorphan (PROMETHAZINE-DM) 6.25-15 MG/5ML syrup Take 5 mLs by mouth 4 (four) times daily as needed. 02/19/24   Leath-Warren, Belen Bowers, NP  SYNTHROID  125 MCG tablet Take 125 mcg by mouth daily before breakfast.  04/16/14   [provider]  venlafaxine  XR (EFFEXOR -XR) 75 MG 24 hr capsule Take 75 mg by mouth daily with breakfast.    [provider]  Past Surgical History Past Surgical History:  Procedure Laterality Date   ABDOMINAL HYSTERECTOMY     ABLATION OF DYSRHYTHMIC FOCUS  05/12/2014   DR [facility] [place] SURGERY     COLONOSCOPY N/A 01/22/2024   Procedure: COLONOSCOPY;  Surgeon: Alanda Allegra, MD;  Location: [facility];  Service: Gastroenterology;  Laterality: N/A;  W/ PROPOFOL    LASIK     SUPRAVENTRICULAR TACHYCARDIA ABLATION N/A 05/12/2014   Procedure: SUPRAVENTRICULAR TACHYCARDIA ABLATION;  Surgeon: Tammie Fall, MD;  Location: [facility] CATH LAB;  Service: Cardiovascular;  Laterality: N/A;   THYROIDECTOMY     TUBAL LIGATION     Family History Family History  Problem Relation Age of Onset   Heart disease Father        Atrial fibrillation; ablation   Heart disease Paternal Aunt    Heart disease Paternal Uncle    Emphysema Paternal Grandmother        ? if smoked   Breast cancer Maternal Grandmother    Breast cancer Paternal Grandmother    Cervical cancer  Other        ? Aunt or Grandmother    Social History Social History   Tobacco Use   Smoking status: Never   Smokeless tobacco: Never  Substance Use Topics   Alcohol use: Yes    Alcohol/week: 0.0 standard drinks of alcohol    Comment: Occasional   Drug use: No   Allergies Dilaudid [hydromorphone hcl]  Review of Systems Review of Systems  Respiratory:  Positive for cough, chest tightness and shortness of breath.     Physical Exam Vital Signs  I have reviewed the triage vital signs BP 131/87   Pulse 85   Temp 99 F (37.2 C) (Oral)   Resp 16   Ht 5\' 9"  (1.753 m)   Wt 92.1 kg   SpO2 95%   BMI 29.98 kg/m   Physical Exam Vitals and nursing note reviewed.  Constitutional:      General: She is not in acute distress.    Appearance: She is well-developed.  HENT:     Head: Normocephalic and atraumatic.  Eyes:     Conjunctiva/sclera: Conjunctivae normal.  Cardiovascular:     Rate and Rhythm: Normal rate and regular rhythm.     Heart sounds: No murmur heard. Pulmonary:     Effort: Pulmonary effort is normal. No respiratory distress.     Breath sounds: Rhonchi present.  Abdominal:     Palpations: Abdomen is soft.     Tenderness: There is no abdominal tenderness.  Musculoskeletal:        General: No swelling.     Cervical back: Neck supple.  Skin:    General: Skin is warm and dry.     Capillary Refill: Capillary refill takes less than 2 seconds.  Neurological:     Mental Status: She is alert.  Psychiatric:        Mood and Affect: Mood normal.     ED Results and Treatments Labs (all labs ordered are listed, but only abnormal results are displayed) Labs Reviewed  BASIC METABOLIC PANEL WITH GFR - Abnormal; Notable for the following components:      Result Value   Glucose, Bld 147 (*)    All other components within normal limits  RESP PANEL BY RT-PCR (RSV, FLU A&B, COVID)  RVPGX2  CBC  TROPONIN I (HIGH SENSITIVITY)  Radiology DG Chest 2 View Result Date: 02/21/2024 CLINICAL DATA:  Chest pain.  Shortness of breath. EXAM: CHEST - 2 VIEW COMPARISON:  10/11/2015 FINDINGS: The cardiomediastinal contours are normal. Mild bronchial thickening. Pulmonary vasculature is normal. No consolidation, pleural effusion, or pneumothorax. No acute osseous abnormalities are seen. IMPRESSION: Mild bronchial thickening. Electronically Signed   By: Chadwick Colonel M.D.   On: 02/21/2024 18:13    Pertinent labs & imaging results that were available during my care of the patient were reviewed by me and considered in my medical decision making (see MDM for details).  Medications Ordered in ED Medications  ipratropium-albuterol  (DUONEB) 0.5-2.5 (3) MG/3ML nebulizer solution (has no administration in time range)  ipratropium-albuterol  (DUONEB) 0.5-2.5 (3) MG/3ML nebulizer solution 3 mL (3 mLs Nebulization Given 02/21/24 1754)  benzonatate (TESSALON) capsule 200 mg (200 mg Oral Given 02/21/24 1834)  ipratropium-albuterol  (DUONEB) 0.5-2.5 (3) MG/3ML nebulizer solution 6 mL (6 mLs Nebulization Given 02/21/24 1846)  guaiFENesin (ROBITUSSIN) 100 MG/5ML liquid 15 mL (15 mLs Oral Given 02/21/24 1834)                                                                                                                                     Procedures Procedures  (including critical care time)  Medical Decision Making / ED Course   This patient presents to the ED for concern of cough, chest tightness, shortness of breath, this involves an extensive number of treatment options, and is a complaint that carries with it a high risk of complications and morbidity.  The differential diagnosis includes pneumonia, reactive airway disease, asthma, COPD, bronchitis  MDM: Patient seen emergency room for evaluation of cough, chest tightness and wheezing.  Physical exam with wheezing  primarily with coughing in the bases but is otherwise unremarkable.  Patient is saturating 97% on room air.  Blood evaluation is reassuringly unremarkable including negative high-sensitivity troponin.  Chest x-ray with bronchial thickening but no evidence of pneumonia.  COVID, flu, RSV negative.  Patient given an initial DuoNeb with Robitussin and Tessalon and on reevaluation symptoms have improved a little but pulmonary exam largely remained the same.  She received 2 additional DuoNebs and is feeling improved.  She already has a home inhaler and a spacer was brought to the patient for increased delivery.  She is also already on steroids and I did encourage her to continue taking his medication.  Presentation is consistent with bronchitis at this time she does not meet inpatient criteria for admission.  Will be discharged with outpatient follow-up and return precautions of which she voiced understanding.   Additional history obtained: -Additional history obtained from husband -External records from outside source obtained and reviewed including: Chart review including previous notes, labs, imaging, consultation notes   Lab Tests: -I ordered, reviewed, and interpreted labs.   The pertinent results include:   Labs Reviewed  BASIC METABOLIC PANEL WITH GFR - Abnormal; Notable for  the following components:      Result Value   Glucose, Bld 147 (*)    All other components within normal limits  RESP PANEL BY RT-PCR (RSV, FLU A&B, COVID)  RVPGX2  CBC  TROPONIN I (HIGH SENSITIVITY)      EKG   EKG Interpretation Date/Time:  Thursday Feb 21 2024 16:58:21 EDT Ventricular Rate:  87 PR Interval:  149 QRS Duration:  87 QT Interval:  384 QTC Calculation: 462 R Axis:   6  Text Interpretation: Sinus rhythm Confirmed by Jakiah Goree ([id number]) on 02/21/2024 5:18:26 PM         Imaging Studies ordered: I ordered imaging studies including chest x-ray I independently visualized and interpreted  imaging. I agree with the radiologist interpretation   Medicines ordered and prescription drug management: Meds ordered this encounter  Medications   ipratropium-albuterol  (DUONEB) 0.5-2.5 (3) MG/3ML nebulizer solution 3 mL   benzonatate (TESSALON) capsule 200 mg   ipratropium-albuterol  (DUONEB) 0.5-2.5 (3) MG/3ML nebulizer solution 6 mL   guaiFENesin (ROBITUSSIN) 100 MG/5ML liquid 15 mL   ipratropium-albuterol  (DUONEB) 0.5-2.5 (3) MG/3ML nebulizer solution    Broadnax, Lucretia A: cabinet override    -I have reviewed the patients home medicines and have made adjustments as needed  Critical interventions none    Cardiac Monitoring: The patient was maintained on a cardiac monitor.  I personally viewed and interpreted the cardiac monitored which showed an underlying rhythm of: NSR  Social Determinants of Health:  Factors impacting patients care include: none   Reevaluation: After the interventions noted above, I reevaluated the patient and found that they have :improved  Co morbidities that complicate the patient evaluation  Past Medical History:  Diagnosis Date   Complication of anesthesia    Dysrhythmia    HX OF SVT   GERD (gastroesophageal reflux disease)    Graves disease    Headache    Hyperlipidemia    PONV (postoperative nausea and vomiting)    SVT (supraventricular tachycardia) (HCC)    Thyroid  disease    Graves; now hypothyroid      Dispostion: I considered admission for this patient, but at this time she does not meet inpatient criteria for admission and will be discharged with outpatient follow-up     Final Clinical Impression(s) / ED Diagnoses Final diagnoses:  None     @PCDICTATION @    Travian Kerner, Alyse July, MD 02/22/24 [phone]

## 2024-02-21 NOTE — Telephone Encounter (Signed)
 Pt called stating her cough has not resolved and was not getting better despite the prescribed medications she got from her recent visit. Spoke with provider Louella Rout. And stated she would need to be re-seen to evaluate her symptoms and get treatment. Pt verbalized understanding.
